# Patient Record
Sex: Female | Born: 1990 | Hispanic: Yes | Marital: Married | Smoking: Never smoker
Health system: Southern US, Community
[De-identification: ages and names within clinical notes are randomized; demographics above are authoritative.]

## PROBLEM LIST (undated history)

## (undated) DIAGNOSIS — F329 Major depressive disorder, single episode, unspecified: Secondary | ICD-10-CM

## (undated) DIAGNOSIS — F32A Depression, unspecified: Secondary | ICD-10-CM

## (undated) DIAGNOSIS — O0930 Supervision of pregnancy with insufficient antenatal care, unspecified trimester: Secondary | ICD-10-CM

## (undated) DIAGNOSIS — L509 Urticaria, unspecified: Secondary | ICD-10-CM

## (undated) HISTORY — DX: Urticaria, unspecified: L50.9

## (undated) HISTORY — DX: Supervision of pregnancy with insufficient antenatal care, unspecified trimester: O09.30

## (undated) HISTORY — DX: Major depressive disorder, single episode, unspecified: F32.9

## (undated) HISTORY — DX: Depression, unspecified: F32.A

---

## 2012-05-25 ENCOUNTER — Encounter: Payer: Self-pay | Admitting: Physician Assistant

## 2013-08-02 ENCOUNTER — Other Ambulatory Visit (HOSPITAL_COMMUNITY): Payer: Self-pay | Admitting: Physician Assistant

## 2013-08-02 DIAGNOSIS — Z3689 Encounter for other specified antenatal screening: Secondary | ICD-10-CM

## 2013-08-02 LAB — OB RESULTS CONSOLE ABO/RH: RH Type: POSITIVE

## 2013-08-02 LAB — OB RESULTS CONSOLE GC/CHLAMYDIA
Chlamydia: NEGATIVE
Gonorrhea: NEGATIVE

## 2013-08-02 LAB — OB RESULTS CONSOLE RUBELLA ANTIBODY, IGM: Rubella: IMMUNE

## 2013-08-02 LAB — OB RESULTS CONSOLE RPR: RPR: NONREACTIVE

## 2013-08-02 LAB — OB RESULTS CONSOLE HIV ANTIBODY (ROUTINE TESTING): HIV: NONREACTIVE

## 2013-08-02 LAB — OB RESULTS CONSOLE HEPATITIS B SURFACE ANTIGEN: Hepatitis B Surface Ag: NEGATIVE

## 2013-08-02 LAB — OB RESULTS CONSOLE ANTIBODY SCREEN: Antibody Screen: NEGATIVE

## 2013-08-05 ENCOUNTER — Encounter (HOSPITAL_COMMUNITY): Payer: Self-pay

## 2013-08-06 ENCOUNTER — Encounter (HOSPITAL_COMMUNITY): Payer: Self-pay

## 2013-08-06 ENCOUNTER — Ambulatory Visit (HOSPITAL_COMMUNITY)
Admission: RE | Admit: 2013-08-06 | Discharge: 2013-08-06 | Disposition: A | Discharge status: Home / Self Care | Payer: Medicaid Other | Source: Ambulatory Visit | Attending: Physician Assistant | Admitting: Physician Assistant

## 2013-08-06 NOTE — Progress Notes (Signed)
Genetic Counseling  High-Risk Gestation Note  Appointment Date:  08/06/2013 Referred By: Kathlen Mody,* Date of Birth:  07/24/1991    Pregnancy History: G2P1001 Estimated Date of Delivery: 11/21/13 Estimated Gestational Age: [redacted]w[redacted]d Attending: Particia Nearing, MD   I met with 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 Rebecca Nash, Rebecca Nash, provided interpretation for today's visit.   We began by reviewing the family history in detail. The patient reported that the father of the pregnancy was born missing his right external ear structure. She reported that he has some hearing loss on the right. To her knowledge, he has had no treatment for this, and he "has never seen a doctor." He is currently 21 years old and otherwise healthy. An underlying cause is not known for the congenital external ear malformation. The patient reported no known exposures for his mother during the pregnancy with him. He reportedly was born late, at approximately 10 months gestation and was described to be small. No additional relatives were reported with external ear malformations, including the couple's 35 year-old daughter.   Incomplete development and growth of the pinna can lead to a small, deformed, or absent (anotia) pinna. Microtia and anotia occur in approximately 1-3 per 10,000 births. The majority of cases are sporadic. However, microtia can be seen due to an underlying genetic syndrome or environmental factor. Underlying genetic syndromes that can have microtia as a feature include Goldenhar syndrome, branchiootorenal syndrome, and mandibulofacial dysostosis. Teratogenic exposures in pregnancy that have been associated with an increased risk for microtia include maternal diabetes, alcohol, isotretinoin, thalidomide, and mycophenolate. We reviewed that external ear malformations have an  increased chance of having associated malformations, such as cardiac defects, renal anomalies or facial clefts. The father of the pregnancy reportedly does not have additional health concerns. However, he has reportedly not been evaluated for additional malformations, such as renal anomalies. Isolated external ear abnormalities, particularly when unilateral, carry a low recurrence risk. However, rare autosomal dominant and autosomal recessive familial cases have been described. Thus, we discussed that recurrence risk could be up to 50%, in the case of autosomal dominant inheritance, but would more likely be low for the current pregnancy. A medical genetics evaluation would also be available to the father of the pregnancy, if desired, to assess for possible underlying causes for the external ear malformation in order to better assess recurrence risk for relatives. Given that an underlying genetic cause is not known for the patient's external ear malformation, prenatal genetic screening or testing would not be available in the current pregnancy. We discussed that targeted ultrasound would be available in later gestation and may be able to view external ear structures. However, [name] understands that ultrasound cannot diagnose or rule out all birth defects prenatally.   Additionally, the father of the pregnancy reportedly has a maternal great aunt with cleft lip and a female maternal first cousin once removed (the great-aunt's son) also with cleft lip. These relatives are reportedly otherwise healthy. An underlying cause is not known for their cleft lip. We discussed that cleft lip +/- cleft palate can be syndromic or isolated.  If these relatives have a syndromic form of clefting, the chance of having an affected child depends on the inheritance pattern of that condition.  If the relatives have an isolated form of clefting, we discussed the probable multifactorial inheritance and explained that  genetic testing for isolated cleft  lip +/- cleft palate is not currently available.  Based on the family history, this couple's chance to have a baby with an isolated cleft lip +/- cleft palate is expected to be the same as the general population risk in the case of multifactorial inheritance. We reviewed that targeted ultrasound is available to assess for orofacial clefting. However, ultrasound cannot diagnose or rule out all birth defects or genetic conditions prenatally.   The father of the pregnancy also reportedly has a female maternal first cousin once removed (his maternal uncle's grandson) with hearing loss. This relative is 21 years old and reportedly has hearing aids. He reportedly does not have external ear malformations. An underlying cause is not known for his hearing loss. Hearing loss can have many causes including genetic factors, environmental factors or a combination of both.  Sometimes hearing loss can occur as one feature of an underlying genetic condition or may be caused by a single nonworking gene. Additional information regarding a cause for their hearing loss is needed in order to most accurately assess the chance for relatives. However, given that this is a fifth degree relative to the pregnancy, recurrence risk would likely be low. Additional information may alter recurrence risk assessment.   [name] reported that her sister had a son with Moebius syndrome. He died at age 21 years old. He was reportedly born paralyzed. The patient reported that her sister's other son is healthy. She reported no additional relatives with health concerns in her family. Moebius syndrome is a congenital, nonprogressive facial weakness with limited abduction of one or both eyes. The facial nerve and abducens nerve are described to be most frequently involved. Additional features can include hearing loss and other cranial nerve dysfunction. Orofacial dysmorphism, limb malformations, motor  disability, musculoskeletal features, and neurodevelopmental delays have also been described.  We discussed that the majority of cases of Moebius syndrome occur sporadically. However, rare familial occurrence has been reported, which is consistent with autosomal dominant inheritance. We discussed that in autosomal dominant inheritance, each offspring of an individual with a particular condition has a 1 in 2 (50%) chance to also inherit the condition. Given the reported family history and given that the majority of case of Moebius syndrome are sporadic, recurrence risk for the current pregnancy would likely be low. The family histories were otherwise found to be noncontributory for birth defects, mental retardation, and known genetic conditions. Without further information regarding the provided family history, an accurate genetic risk cannot be calculated. Further genetic counseling is warranted if more information is obtained.  [name] previously had ultrasound performed in [place] on 07/14/13, prior to moving to [place]. Ultrasound report from that date indicates that visualized fetal anatomy appeared normal. Additionally, the ultrasound report from that date indicates that the patient had an elevated inhibin. However, we do not have records of this result to confirm this report and do not have documentation of the specific level of inhibin. Elevated maternal serum inhibin has been associated with an increased risk for growth restriction or poor pregnancy outcome later in pregnancy; therefore, a follow up ultrasound is typically offered for fetal growth in the third trimester. [name] is scheduled for follow-up ultrasound at [facility] on 08/25/13.   [name] denied exposure to environmental toxins or chemical agents. She denied the use of alcohol, tobacco or street drugs. She denied significant viral illnesses during the course of her  pregnancy. Her medical and surgical histories were noncontributory.  I counseled [name] regarding the above risks and available options.  The approximate face-to-face time with the genetic counselor was 45 minutes.  Quinn Plowman, MS Certified Genetic Counselor 08/06/2013

## 2013-08-10 ENCOUNTER — Encounter (HOSPITAL_COMMUNITY): Payer: Self-pay

## 2013-08-10 DIAGNOSIS — Z8489 Family history of other specified conditions: Secondary | ICD-10-CM | POA: Insufficient documentation

## 2013-08-10 DIAGNOSIS — Z8279 Family history of other congenital malformations, deformations and chromosomal abnormalities: Secondary | ICD-10-CM | POA: Insufficient documentation

## 2013-08-25 ENCOUNTER — Ambulatory Visit (HOSPITAL_COMMUNITY)
Admission: RE | Admit: 2013-08-25 | Discharge: 2013-08-25 | Disposition: A | Discharge status: Home / Self Care | Payer: Medicaid Other | Source: Ambulatory Visit | Attending: Physician Assistant | Admitting: Physician Assistant

## 2013-08-25 ENCOUNTER — Ambulatory Visit (HOSPITAL_COMMUNITY): Admission: RE | Admit: 2013-08-25 | Payer: Medicaid Other | Source: Ambulatory Visit

## 2013-08-25 DIAGNOSIS — Z3689 Encounter for other specified antenatal screening: Secondary | ICD-10-CM | POA: Insufficient documentation

## 2013-08-25 DIAGNOSIS — O0930 Supervision of pregnancy with insufficient antenatal care, unspecified trimester: Secondary | ICD-10-CM | POA: Insufficient documentation

## 2013-08-25 DIAGNOSIS — O352XX0 Maternal care for (suspected) hereditary disease in fetus, not applicable or unspecified: Secondary | ICD-10-CM | POA: Insufficient documentation

## 2013-10-28 LAB — OB RESULTS CONSOLE GBS: GBS: NEGATIVE

## 2013-11-11 NOTE — L&D Delivery Note (Signed)
Delivery Note At 4:05 AM a viable female was delivered via Vaginal, Spontaneous Delivery (Presentation: ;Occiput Anterior).  APGAR: 8, 9; weight: pending.   Placenta status: spontaneous, intact.  Cord: 3 vessels with the following complications: None.  Cord pH: not sent  Anesthesia: Epidural  Episiotomy: None Lacerations: None Suture Repair: N/A Est. Blood Loss (mL): 150  Mom to postpartum.  Baby to Couplet care / Skin to Skin.  Selena LesserBraimah, Tina 11/27/2013, 5:18 AM  I have seen and examined this patient and I agree with the above. Cam HaiSHAW, KIMBERLY 7:58 AM 11/27/2013

## 2013-11-22 ENCOUNTER — Other Ambulatory Visit (HOSPITAL_COMMUNITY): Payer: Self-pay | Admitting: Nurse Practitioner

## 2013-11-22 DIAGNOSIS — O480 Post-term pregnancy: Secondary | ICD-10-CM

## 2013-11-24 ENCOUNTER — Encounter (HOSPITAL_COMMUNITY): Payer: Self-pay | Admitting: *Deleted

## 2013-11-24 ENCOUNTER — Telehealth (HOSPITAL_COMMUNITY): Payer: Self-pay | Admitting: *Deleted

## 2013-11-24 NOTE — Telephone Encounter (Signed)
Preadmission screen [phone] interpreter number

## 2013-11-26 ENCOUNTER — Inpatient Hospital Stay (HOSPITAL_COMMUNITY): Payer: Medicaid Other | Admitting: Anesthesiology

## 2013-11-26 ENCOUNTER — Ambulatory Visit (HOSPITAL_COMMUNITY)
Admission: RE | Admit: 2013-11-26 | Discharge: 2013-11-26 | Disposition: A | Discharge status: Home / Self Care | Payer: Medicaid Other | Source: Ambulatory Visit | Attending: Nurse Practitioner | Admitting: Nurse Practitioner

## 2013-11-26 ENCOUNTER — Encounter (HOSPITAL_COMMUNITY): Payer: Medicaid Other | Admitting: Anesthesiology

## 2013-11-26 ENCOUNTER — Inpatient Hospital Stay (HOSPITAL_COMMUNITY)
Admission: AD | Admit: 2013-11-26 | Discharge: 2013-11-28 | DRG: 775 | Disposition: A | Discharge status: Home / Self Care | Payer: Medicaid Other | Source: Ambulatory Visit | Attending: Obstetrics & Gynecology | Admitting: Obstetrics & Gynecology

## 2013-11-26 ENCOUNTER — Encounter (HOSPITAL_COMMUNITY): Payer: Self-pay | Admitting: *Deleted

## 2013-11-26 DIAGNOSIS — O480 Post-term pregnancy: Secondary | ICD-10-CM

## 2013-11-26 DIAGNOSIS — Z8279 Family history of other congenital malformations, deformations and chromosomal abnormalities: Secondary | ICD-10-CM

## 2013-11-26 DIAGNOSIS — IMO0001 Reserved for inherently not codable concepts without codable children: Secondary | ICD-10-CM

## 2013-11-26 DIAGNOSIS — Z8489 Family history of other specified conditions: Secondary | ICD-10-CM

## 2013-11-26 LAB — CBC
HCT: 41.7 % (ref 36.0–46.0)
Hemoglobin: 14.5 g/dL (ref 12.0–15.0)
MCH: 29.7 pg (ref 26.0–34.0)
MCHC: 34.8 g/dL (ref 30.0–36.0)
MCV: 85.3 fL (ref 78.0–100.0)
Platelets: 136 10*3/uL — ABNORMAL LOW (ref 150–400)
RBC: 4.89 MIL/uL (ref 3.87–5.11)
RDW: 13.5 % (ref 11.5–15.5)
WBC: 13.3 10*3/uL — ABNORMAL HIGH (ref 4.0–10.5)

## 2013-11-26 MED ORDER — EPHEDRINE 5 MG/ML INJ: 10.0000 mg | INTRAVENOUS | Status: DC | PRN

## 2013-11-26 MED ORDER — IBUPROFEN 600 MG PO TABS
600.0000 mg | ORAL_TABLET | Freq: Four times a day (QID) | ORAL | Status: DC | PRN
Start: 2013-11-26 — End: 2013-11-27
  Administered 2013-11-27: 600 mg via ORAL
  Filled 2013-11-26: qty 1

## 2013-11-26 MED ORDER — CITRIC ACID-SODIUM CITRATE 334-500 MG/5ML PO SOLN
30.0000 mL | ORAL | Status: DC | PRN
Start: 2013-11-26 — End: 2013-11-27

## 2013-11-26 MED ORDER — FENTANYL 2.5 MCG/ML BUPIVACAINE 1/10 % EPIDURAL INFUSION (WH - ANES)
14.0000 mL/h | INTRAMUSCULAR | Status: DC | PRN
  Administered 2013-11-26: 14 mL/h via EPIDURAL
  Filled 2013-11-26: qty 125

## 2013-11-26 MED ORDER — PHENYLEPHRINE 40 MCG/ML (10ML) SYRINGE FOR IV PUSH (FOR BLOOD PRESSURE SUPPORT): 80.0000 ug | PREFILLED_SYRINGE | INTRAVENOUS | Status: DC | PRN

## 2013-11-26 MED ORDER — PHENYLEPHRINE 40 MCG/ML (10ML) SYRINGE FOR IV PUSH (FOR BLOOD PRESSURE SUPPORT)
80.0000 ug | PREFILLED_SYRINGE | INTRAVENOUS | Status: DC | PRN
  Filled 2013-11-26: qty 10

## 2013-11-26 MED ORDER — OXYCODONE-ACETAMINOPHEN 5-325 MG PO TABS
1.0000 | ORAL_TABLET | ORAL | Status: DC | PRN
  Administered 2013-11-27: 2 via ORAL
  Filled 2013-11-26: qty 2

## 2013-11-26 MED ORDER — ONDANSETRON HCL 4 MG/2ML IJ SOLN
4.0000 mg | Freq: Four times a day (QID) | INTRAMUSCULAR | Status: DC | PRN
  Administered 2013-11-27: 4 mg via INTRAVENOUS
  Filled 2013-11-26: qty 2

## 2013-11-26 MED ORDER — LACTATED RINGERS IV SOLN
INTRAVENOUS | Status: DC
  Administered 2013-11-26: via INTRAVENOUS

## 2013-11-26 MED ORDER — OXYTOCIN BOLUS FROM INFUSION
500.0000 mL | INTRAVENOUS | Status: DC
  Administered 2013-11-27: 500 mL via INTRAVENOUS

## 2013-11-26 MED ORDER — LACTATED RINGERS IV SOLN
500.0000 mL | Freq: Once | INTRAVENOUS | Status: AC
  Administered 2013-11-26: 500 mL via INTRAVENOUS

## 2013-11-26 MED ORDER — LIDOCAINE HCL (PF) 1 % IJ SOLN
INTRAMUSCULAR | Status: DC | PRN
  Administered 2013-11-26 (×2): 5 mL

## 2013-11-26 MED ORDER — EPHEDRINE 5 MG/ML INJ
10.0000 mg | INTRAVENOUS | Status: DC | PRN
  Filled 2013-11-26: qty 4

## 2013-11-26 MED ORDER — DIPHENHYDRAMINE HCL 50 MG/ML IJ SOLN: 12.5000 mg | INTRAMUSCULAR | Status: DC | PRN

## 2013-11-26 MED ORDER — OXYTOCIN 40 UNITS IN LACTATED RINGERS INFUSION - SIMPLE MED
62.5000 mL/h | INTRAVENOUS | Status: DC
  Administered 2013-11-27: 62.5 mL/h via INTRAVENOUS
  Filled 2013-11-26: qty 1000

## 2013-11-26 MED ORDER — LIDOCAINE HCL (PF) 1 % IJ SOLN
30.0000 mL | INTRAMUSCULAR | Status: DC | PRN
  Filled 2013-11-26: qty 30

## 2013-11-26 MED ORDER — ACETAMINOPHEN 325 MG PO TABS: 650.0000 mg | ORAL_TABLET | ORAL | Status: DC | PRN

## 2013-11-26 MED ORDER — LACTATED RINGERS IV SOLN
500.0000 mL | INTRAVENOUS | Status: DC | PRN
  Administered 2013-11-26: 500 mL via INTRAVENOUS

## 2013-11-26 NOTE — Anesthesia Procedure Notes (Signed)
Epidural Patient location during procedure: OB Start time: 11/26/2013 11:29 PM  Staffing Anesthesiologist: Brayton CavesJACKSON, Mikal Wisman Performed by: anesthesiologist   Preanesthetic Checklist Completed: patient identified, site marked, surgical consent, pre-op evaluation, timeout performed, IV checked, risks and benefits discussed and monitors and equipment checked  Epidural Patient position: sitting Prep: site prepped and draped and DuraPrep Patient monitoring: continuous pulse ox and blood pressure Approach: midline Injection technique: LOR air  Needle:  Needle type: Tuohy  Needle gauge: 17 G Needle length: 9 cm and 9 Needle insertion depth: 5 cm cm Catheter type: closed end flexible Catheter size: 19 Gauge Catheter at skin depth: 10 cm Test dose: negative  Assessment Events: blood not aspirated, injection not painful, no injection resistance, negative IV test and no paresthesia  Additional Notes Patient identified.  Risk benefits discussed including failed block, incomplete pain control, headache, nerve damage, paralysis, blood pressure changes, nausea, vomiting, reactions to medication both toxic or allergic, and postpartum back pain.  Patient expressed understanding and wished to proceed.  All questions were answered.  Sterile technique used throughout procedure and epidural site dressed with sterile barrier dressing. No paresthesia or other complications noted.The patient did not experience any signs of intravascular injection such as tinnitus or metallic taste in mouth nor signs of intrathecal spread such as rapid motor block. Please see nursing notes for vital signs.

## 2013-11-26 NOTE — H&P (Signed)
  History     CSN: [id number]  Arrival date and time: 11/26/13 1906   First Provider Initiated Contact with Patient 11/26/13 2136      Chief Complaint  Patient presents with  . Contractions   HPI This is a 23 y.o. female at [redacted]w[redacted]d who presents with c/o contractions. Also has some blood tinged mucous.   RN Note: I'm having contractions from 5-7mins apart. Was 2cm last ck at health dept. Ctx since 1630. Some mucousy bloody dc      OB History   Grav Para Term Preterm Abortions TAB SAB Ect Mult Living   2 1 1 0 0 0 0 0 0 1      Past Medical History  Diagnosis Date  . Depression     was on meds prior to moving here  . Late prenatal care     History reviewed. No pertinent past surgical history.  Family History  Problem Relation Age of Onset  . Other Other     Moebius syndrome  . Hypertension Mother   . Anemia Daughter     blood transfusion at 10mos  . Vision loss Daughter     eye surgery    History  Substance Use Topics  . Smoking status: Never Smoker   . Smokeless tobacco: Never Used  . Alcohol Use: Yes     Comment: early in pregnancy    Allergies: No Known Allergies  No prescriptions prior to admission    Review of Systems  Constitutional: Negative for fever, chills and malaise/fatigue.  Gastrointestinal: Positive for abdominal pain. Negative for nausea and vomiting.  Genitourinary: Negative for dysuria.  Neurological: Negative for dizziness and headaches.   Physical Exam   Blood pressure 115/71, pulse 80, temperature 98.8 F (37.1 C), resp. rate 20, height 4' 11.5" (1.511 m), weight 70.398 kg (155 lb 3.2 oz), last menstrual period 02/14/2013.  Physical Exam  Constitutional: She is oriented to person, place, and time. She appears well-developed and well-nourished. No distress.  Cardiovascular: Normal rate.   Respiratory: Effort normal.  GI: Soft. There is no tenderness. There is no rebound and no guarding.  Genitourinary: Vagina normal and uterus  normal.  Dilation: 3.5 Effacement (%): 70 Cervical Position: Middle Station: -2 Presentation: Vertex Exam by:: L. Munford NR   Musculoskeletal: Normal range of motion.  Neurological: She is alert and oriented to person, place, and time.  Skin: Skin is warm and dry.  Psychiatric: She has a normal mood and affect.   FHR reactive UCs every 3 - 4 minutes  MAU Course  Procedures  MDM Will have RN recheck patient one hour after first check  >>  Dilation: 4 Effacement (%): 70 Cervical Position: Middle Station: -2 Presentation: Vertex Exam by:: L. Munford RN   Assessment and Plan  A:  SIUP at [redacted]w[redacted]d       Labor Eval  >> Active Labor  P:  Admit to Birthing Suites      Routine orders      Desires epidural      GBS NEG  Jillene Wehrenberg 11/26/2013, 9:47 PM  

## 2013-11-26 NOTE — Anesthesia Preprocedure Evaluation (Signed)

## 2013-11-26 NOTE — MAU Provider Note (Signed)
  History     CSN: [id number]  Arrival date and time: 11/26/13 1906   First Provider Initiated Contact with Patient 11/26/13 2136      Chief Complaint  Patient presents with  . Contractions   HPI This is a 23 y.o. female at 707w5d who presents with c/o contractions. Also has some blood tinged mucous.   RN Note: I'm having contractions from 5-727mins apart. Was 2cm last ck at health dept. Ctx since 1630. Some mucousy bloody dc      OB History   Grav Para Term Preterm Abortions TAB SAB Ect Mult Living   2 1 1  0 0 0 0 0 0 1      Past Medical History  Diagnosis Date  . Depression     was on meds prior to moving here  . Late prenatal care     History reviewed. No pertinent past surgical history.  Family History  Problem Relation Age of Onset  . Other Other     Moebius syndrome  . Hypertension Mother   . Anemia Daughter     blood transfusion at 10mos  . Vision loss Daughter     eye surgery    History  Substance Use Topics  . Smoking status: Never Smoker   . Smokeless tobacco: Never Used  . Alcohol Use: Yes     Comment: early in pregnancy    Allergies: No Known Allergies  No prescriptions prior to admission    Review of Systems  Constitutional: Negative for fever, chills and malaise/fatigue.  Gastrointestinal: Positive for abdominal pain. Negative for nausea and vomiting.  Genitourinary: Negative for dysuria.  Neurological: Negative for dizziness and headaches.   Physical Exam   Blood pressure 115/71, pulse 80, temperature 98.8 F (37.1 C), resp. rate 20, height 4' 11.5" (1.511 m), weight 70.398 kg (155 lb 3.2 oz), last menstrual period 02/14/2013.  Physical Exam  Constitutional: She is oriented to person, place, and time. She appears well-developed and well-nourished. No distress.  Cardiovascular: Normal rate.   Respiratory: Effort normal.  GI: Soft. There is no tenderness. There is no rebound and no guarding.  Genitourinary: Vagina normal and uterus  normal.  Dilation: 3.5 Effacement (%): 70 Cervical Position: Middle Station: -2 Presentation: Vertex Exam by:: L. Munford NR   Musculoskeletal: Normal range of motion.  Neurological: She is alert and oriented to person, place, and time.  Skin: Skin is warm and dry.  Psychiatric: She has a normal mood and affect.   FHR reactive UCs every 3 - 4 minutes  MAU Course  Procedures  MDM Will have RN recheck patient one hour after first check  >>  Dilation: 4 Effacement (%): 70 Cervical Position: Middle Station: -2 Presentation: Vertex Exam by:: L. Munford RN   Assessment and Plan  A:  SIUP at 757w5d       Labor Eval  >> Active Labor  [facility]      Routine orders      Desires epidural      GBS NEG  [facility]ILLIAMS,MARIE 11/26/2013, 9:47 PM

## 2013-11-26 NOTE — MAU Note (Signed)
I'm having contractions from 5-27mins apart. Was 2cm last ck at health dept. Ctx since 1630. Some mucousy bloody dc

## 2013-11-27 ENCOUNTER — Encounter (HOSPITAL_COMMUNITY): Payer: Self-pay | Admitting: *Deleted

## 2013-11-27 LAB — RPR: RPR Ser Ql: NONREACTIVE

## 2013-11-27 LAB — TYPE AND SCREEN
ABO/RH(D): A POS
Antibody Screen: NEGATIVE

## 2013-11-27 LAB — ABO/RH: ABO/RH(D): A POS

## 2013-11-27 MED ORDER — DIBUCAINE 1 % RE OINT
1.0000 "application " | TOPICAL_OINTMENT | RECTAL | Status: DC | PRN
  Filled 2013-11-27: qty 28

## 2013-11-27 MED ORDER — ONDANSETRON HCL 4 MG PO TABS: 4.0000 mg | ORAL_TABLET | ORAL | Status: DC | PRN

## 2013-11-27 MED ORDER — SIMETHICONE 80 MG PO CHEW: 80.0000 mg | CHEWABLE_TABLET | ORAL | Status: DC | PRN

## 2013-11-27 MED ORDER — ZOLPIDEM TARTRATE 5 MG PO TABS: 5.0000 mg | ORAL_TABLET | Freq: Every evening | ORAL | Status: DC | PRN

## 2013-11-27 MED ORDER — OXYCODONE-ACETAMINOPHEN 5-325 MG PO TABS
1.0000 | ORAL_TABLET | ORAL | Status: DC | PRN
  Administered 2013-11-27: 2 via ORAL
  Administered 2013-11-27: 1 via ORAL
  Administered 2013-11-28 (×2): 2 via ORAL
  Filled 2013-11-27: qty 1
  Filled 2013-11-27 (×2): qty 2
  Filled 2013-11-27: qty 1
  Filled 2013-11-27: qty 2

## 2013-11-27 MED ORDER — IBUPROFEN 600 MG PO TABS
600.0000 mg | ORAL_TABLET | Freq: Four times a day (QID) | ORAL | Status: DC
  Administered 2013-11-27 – 2013-11-28 (×5): 600 mg via ORAL
  Filled 2013-11-27 (×5): qty 1

## 2013-11-27 MED ORDER — LANOLIN HYDROUS EX OINT: TOPICAL_OINTMENT | CUTANEOUS | Status: DC | PRN

## 2013-11-27 MED ORDER — TETANUS-DIPHTH-ACELL PERTUSSIS 5-2.5-18.5 LF-MCG/0.5 IM SUSP: 0.5000 mL | Freq: Once | INTRAMUSCULAR | Status: DC

## 2013-11-27 MED ORDER — SENNOSIDES-DOCUSATE SODIUM 8.6-50 MG PO TABS
2.0000 | ORAL_TABLET | ORAL | Status: DC
  Administered 2013-11-27: 2 via ORAL
  Filled 2013-11-27: qty 2

## 2013-11-27 MED ORDER — ONDANSETRON HCL 4 MG/2ML IJ SOLN: 4.0000 mg | INTRAMUSCULAR | Status: DC | PRN

## 2013-11-27 MED ORDER — BENZOCAINE-MENTHOL 20-0.5 % EX AERO
1.0000 | INHALATION_SPRAY | CUTANEOUS | Status: DC | PRN
Start: 2013-11-27 — End: 2013-11-28
  Filled 2013-11-27: qty 56

## 2013-11-27 MED ORDER — PRENATAL MULTIVITAMIN CH
1.0000 | ORAL_TABLET | Freq: Every day | ORAL | Status: DC
  Administered 2013-11-27 – 2013-11-28 (×2): 1 via ORAL
  Filled 2013-11-27 (×2): qty 1

## 2013-11-27 MED ORDER — DIPHENHYDRAMINE HCL 25 MG PO CAPS: 25.0000 mg | ORAL_CAPSULE | Freq: Four times a day (QID) | ORAL | Status: DC | PRN

## 2013-11-27 MED ORDER — WITCH HAZEL-GLYCERIN EX PADS
1.0000 | MEDICATED_PAD | CUTANEOUS | Status: DC | PRN
Start: 2013-11-27 — End: 2013-11-28

## 2013-11-27 NOTE — Lactation Note (Signed)
This note was copied from the chart of Rebecca Demitria Nash. Lactation Consultation Note  Patient Name: Rebecca Nash ZOXWR'UToday's Date: 11/27/2013 Reason for consult: Initial assessment;Other (Comment) (charting for exclusion) This is mom's second child. Mom has 794 yo and states she breastfed for 2 years.  However, she is giving some formula with bottle, stating that she has only a "little milk" so LC demonstrated small newborn stomach size during baby's first few days and discussed why exclusive breastfeeding is the ideal, based on LEAD guidelines.  FOB present and speaks some English but LC speaks Spanish and parents deny any questions after LC completes initial assessment and breastfeeding review.  LC encouraged STS and cue feedings.  LC provided [employer] Resource brochure in  Spanish, and reviewed WH services and list of community and web site resources. LC encouraged review of Baby and Me pp 13-16 for review of BF information in [place].   Maternal Data Formula Feeding for Exclusion: Yes Reason for exclusion: Mother's choice to formula and breast feed on admission Infant to breast within first hour of birth: Yes (initial LATCH score=7; nursed 15 minutes) Has patient been taught Hand Expression?: Yes (mom informs LC that she knows how to express colostrum and "sees drops:) Does the patient have breastfeeding experience prior to this delivery?: Yes  Feeding Feeding Type: Bottle Fed - Formula Length of feed: 40 min  LATCH Score/Interventions              Most recent LATCH score by RN assessment=8; baby has fed for 15-40 minutes 4 times since birth (now 113 hours old) but has also received 3 formula feedings of 10 ml's each        Lactation Tools Discussed/Used   STS, cue feedings LEAD cautions regarding formula supplement and small newborn stomach size and reasons for colostrum phase  Consult Status Consult Status: Follow-up Date: 11/28/13 Follow-up type:  In-patient    Warrick ParisianBryant, Eino Whitner [facility] 11/27/2013, 5:44 PM

## 2013-11-27 NOTE — Progress Notes (Signed)
Pt because nauseated, gaged a few times after completing peri care in bathroom.  Pt put back in bed and BP obtained. Zofran given, see MAR

## 2013-11-28 MED ORDER — IBUPROFEN 600 MG PO TABS: 600.0000 mg | ORAL_TABLET | Freq: Four times a day (QID) | ORAL | Status: DC | PRN

## 2013-11-28 MED ORDER — PRENATAL MULTIVITAMIN CH: 1.0000 | ORAL_TABLET | Freq: Every day | ORAL | Status: DC

## 2013-11-28 NOTE — Progress Notes (Signed)
Clinical Social Work Department PSYCHOSOCIAL ASSESSMENT - MATERNAL/CHILD 11/28/2013  Patient:  Rebecca Nash,Rebecca Nash  Account Number:  [id number]  Admit Date:  11/26/2013  Childs Name:   Rebecca Nash    Clinical Social Worker:  CUMI BEVEL, LCSW   Date/Time:  11/28/2013 09:00 AM  Date Referred:  11/27/2013   Referral source  Central Nursery     Referred reason  Depression/Anxiety   Other referral source:    I:  FAMILY / HOME ENVIRONMENT Child's legal guardian:  PARENT  Guardian - Name Guardian - Age Guardian - Address  Rebecca Nash,Angelize [address]   [place], [place] [address]  Nash, Rebecca  same as above   Other household support members/support persons Other support:    II  PSYCHOSOCIAL DATA Information Source:    Financial and Community Resources Employment:   Spouse is employed   Financial resources:  Medicaid If Medicaid - County:   Other  WIC  Food Stamps   School / Grade:   Maternity Care Coordinator / Child Services Coordination / Early Interventions:  Cultural issues impacting care:    III  STRENGTHS Strengths  Supportive family/friends  Home prepared for Child (including basic supplies)  Adequate Resources   Strength comment:    IV  RISK FACTORS AND CURRENT PROBLEMS Current Problem:     Risk Factor & Current Problem Patient Issue Family Issue Risk Factor / Current Problem Comment   Y N hx of depression    V  SOCIAL WORK ASSESSMENT Acknowledged order for Social Work consult to assess pt's history of depression.  Parents are married and have one other dependent age 4.  Met with both parents.  Spanish translator Debbie Salazar facilitated the interview. Parents were pleasant but guarded about mother's hx of depression.  She was unwilling to discuss precipitating factors that led to her depression.  Informed that she started seeing a therapist about 8 months ago while living in [place].  She denies any hx of taking medication to manage  her depression.    She denies any hx of psychiatric hospitalization.  Informed that she is now working with a local therapist.  She denies any current symptoms of depression or anxiety.    She denies any use of alcohol of illicit drug use during pregnancy.  Discussed signs/symptoms of PP depression with mother and spouse. They report adequate support at home.  No acute social concerns reported or noted at this time.  Parents informed of social work availability.      VI SOCIAL WORK PLAN Social Work Plan  No Further Intervention Required / No Barriers to Discharge   Type of pt/family education:   If child protective services report - county:   If child protective services report - date:   Information/referral to community resources comment:   Other social work plan:     

## 2013-11-28 NOTE — Discharge Instructions (Signed)
Cuidados en el postparto luego de un parto vaginal  (Postpartum Care After Vaginal Delivery) Despus del parto (perodo de postparto), la estada normal en el hospital es de 24-72 horas. Si hubo problemas con el trabajo de parto o el parto, o si tiene otros problemas mdicos, es posible que Patent attorney en el hospital por ms [place].  Mientras est en el hospital, recibir [place] e instrucciones sobre cmo cuidar de usted misma y de su beb recin nacido durante el postparto.  Mientras est en el hospital:   Asegrese de decirle a las enfermeras si siente dolor o Tree surgeon, as como donde Designer, television/film set y Architect.  Si usted tuvo una incisin cerca de la vagina (episiotoma) o si ha tenido Education officer, museum, las enfermeras le pondrn hielo sobre la episiotoma o Psychiatrist. Las bolsas de hielo pueden ayudar a Dietitian y la hinchazn.  Si est amamantando, puede sentir contracciones dolorosas en el tero durante algunas semanas. Esto es normal. Las contracciones ayudan a que el tero vuelva a su tamao normal.  Es normal tener algo de sangrado despus del Placedo.  Durante los primeros 1-3 das despus del parto, el flujo es de color rojo y la cantidad puede ser similar a un perodo.  Es frecuente que el flujo se inicie y se Production assistant, radio.  En los primeros [place], puede eliminar algunos cogulos pequeos. Informe a las enfermeras si elimina cogulos grandes o aumenta el flujo.  No  elimine los cogulos de sangre por el inodoro antes de que la [employer] Mining vea.  Durante los prximos 3 a [address] despus del parto, el flujo debe ser ms acuoso y rosado o Forensic psychologist.  Chancy Hurter a catorce [employer] del parto, el flujo debe ser una pequea cantidad de secrecin de color blanco amarillento.  La cantidad de flujo disminuir en las primeras semanas despus del parto. El flujo puede detenerse en 6-8 semanas. La mayora de las mujeres no tienen ms flujo a las 12 semanas  despus del [place].  Usted debe cambiar sus apsitos con frecuencia.  Lvese bien las manos con agua y jabn durante al menos 20 segundos despus de cambiar el apsito, usar el bao o antes de Nature conservation officer o Research scientist (life sciences) a su recin nacido.  Usted podr sentir como que tiene que vaciar la vejiga durante las primeras 6-8 horas despus del [place].  En caso de que sienta debilidad, mareo o [place], llame a la enfermera antes de levantarse de la cama por primera vez y antes de tomar una ducha por primera vez.  Dentro de los Coca[employer]a del parto, sus mamas pueden comenzar a estar sensibles y [place]. Esto se llama congestin. La sensibilidad en los senos por lo general desaparece dentro de las 48-72 horas despus de que ocurre la congestin. Tambin puede notar que la [place] se escapa de sus senos. Si no est amamantando no estimule sus pechos. La estimulacin de las mamas hace que sus senos produzcan ms [place].  Pasar tanto tiempo como le sea posible con el beb recin nacido es muy importante. Durante ese tiempo, usted y su beb deben sentirse cerca y conocerse uno al otro. Tener al beb en su habitacin (alojamiento conjunto) ayudar a fortalecer el vnculo con el beb recin nacido.Esto le dar tiempo para conocerlo y atenderlo de [place] cmoda.  Las hormonas se modifican despus del parto. A veces, los cambios hormonales pueden causar tristeza o ganas de llorar por un tiempo. Estos sentimientos  no deben durar ms de Hughes Supplyunos pocos das. Si duran ms que eso, debe hablar con su mdico.  Si lo desea, hable con su mdico acerca de los mtodos de planificacin familiar o mtodos anticonceptivos.  Hable con su mdico acerca de las vacunas. El mdico puede indicarle que se aplique las siguientes vacunas antes de salir del hospital:  [place] and PrincipeVacuna contra el ttanos, la difteria y la tos ferina (Tdap) o el ttanos y la difteria (Td). Es muy importante que usted y su familia (incluyendo a los abuelos) u otras  personas que cuidan al recin nacido estn al da con las vacunas Tdap o Td. Las vacunas Tdap o Td pueden ayudar a proteger al recin nacido de enfermedades.  Inmunizacin contra la rubola.  Inmunizacin contra la varicela.  Inmunizacin contra la gripe. Usted debe recibir esta vacunacin anual si no la ha recibido Academic librariandurante el embarazo. Document Released: 08/25/2007 Document Revised: 07/22/2012 [facility] Patient Information 2014 [place]ExitCare, MarylandLLC.  Lactancia materna (Breastfeeding) Decidir Museum/gallery exhibitions officeramamantar es una de las mejores elecciones que puede hacer por usted y su beb. El cambio hormonal durante el Psychiatristembarazo produce el desarrollo del tejido mamario y Lesothoaumenta la cantidad y el tamao de los conductos galactforos. Estas hormonas tambin permiten que las protenas, los azcares y las grasas de la sangre produzcan la WPS Resourcesleche materna en las glndulas productoras de Euniceleche. Las hormonas impiden que la leche materna sea liberada antes del nacimiento del beb, adems de impulsar el flujo de leche luego del nacimiento. Una vez que ha comenzado a Museum/gallery exhibitions officeramamantar, Conservation officer, naturepensar en el beb, as Immunologistcomo la succin o Theatre managerel llanto, pueden estimular la liberacin de Country Clubleche de las glndulas productoras de S[place] Springsleche.  LOS BENEFICIOS DE AMAMANTAR Para el beb  La primera leche (calostro) ayuda al mejor funcionamiento del sistema digestivo del beb.  La leche tiene anticuerpos que ayudan a Radio producerprevenir las infecciones en el beb.  El beb tiene una menor incidencia de asma, alergias y del sndrome de muerte sbita del lactante.  Los nutrientes en la Portlandleche materna son mejores para el beb que la Newingtonleche maternizada y estn preparados exclusivamente para cubrir las necesidades del beb.  La leche materna mejora el desarrollo cerebral del beb.  Es menos probable que el beb desarrolle otras enfermedades, como obesidad infantil, asma o diabetes mellitus de tipo 2. Para usted   La lactancia materna favorece el desarrollo de un vnculo muy especial  entre la madre y el beb.  Es conveniente. Siempre est disponible a la temperatura correcta y es Boydeconmica.  La lactancia materna ayuda a quemar caloras y a perder el peso ganado durante el Somersetembarazo.  Favorece la contraccin del tero al tamao que tena antes del embarazo de manera ms rpida y disminuye el sangrado (loquios) despus del parto.  La lactancia materna contribuye a reducir Nurse, adultel riesgo de desarrollar diabetes mellitus de tipo 2, osteoporosis o cncer de mama o de ovario en el futuro. SIGNOS DE QUE EL BEB EST HAMBRIENTO Primeros signos de [address]re  Aumenta su estado de Lesothoalerta o actividad.  Se estira.  Mueve la cabeza de un lado a otro.  Mueve la cabeza y abre la boca cuando se le toca la mejilla o la comisura de la boca (reflejo de bsqueda).  Aumenta las vocalizaciones, tales como sonidos de succin, se relame los labios, emite arrullos, suspiros, o chirridos.  Mueve la Jones Apparel Groupmano hacia la boca.  Se chupa con ganas los dedos o las manos. Signos tardos de Fisher Scientifichambre  Est agitado.  Ninfa LindenLlora de manera  intermitente. Signos de AES Corporation signos de hambre extrema requerirn que lo calme y lo consuele antes de que el beb pueda alimentarse adecuadamente. No espere a que se manifiesten los siguientes signos de hambre extrema para comenzar a Museum/gallery exhibitions officer:   Designer, jewellery.  Llanto intenso y fuerte.   Gritos. INFORMACIN BSICA SOBRE LA LACTANCIA MATERNA Iniciacin de la lactancia materna  Encuentre un lugar cmodo para sentarse o acostarse, con un buen respaldo para el cuello y la espalda.  Coloque una almohada o una manta enrollada debajo del beb para acomodarlo a la altura de la mama (si est sentada). Las almohadas para Museum/gallery exhibitions officer se han diseado especialmente a fin de servir de apoyo para los brazos y el beb [employer].  Asegrese de que el abdomen del beb est frente al suyo.  Masajee suavemente la mama. Con las yemas de los dedos, masajee la pared del pecho  hacia el pezn en un movimiento circular. Esto estimula el flujo de [place]. Es posible que Engineer, manufacturing systems este movimiento mientras amamanta si la leche fluye lentamente.  Sostenga la mama con el pulgar por arriba del pezn y los otros 4 dedos por debajo de la mama. Asegrese de que los dedos se encuentren lejos del pezn y de la boca del beb.  Empuje suavemente los labios del beb con el pezn o con el dedo.  Cuando la boca del beb se abra lo suficiente, acrquelo rpidamente a la mama e introduzca todo el pezn y la zona oscura que lo rodea (areola), tanto como sea posible, dentro de la boca del beb.  Debe haber ms areola visible por arriba del labio superior del beb que por debajo del labio inferior.  La lengua del beb debe estar entre la enca inferior y la [place].  Asegrese de que la boca del beb est en la posicin correcta alrededor del pezn (prendida). Los labios del beb deben crear un sello sobre la mama, doblndose hacia afuera (invertidos).  Es comn que el beb succione durante 2 a 3 minutos para que comience el flujo de [place]. Cmo debe prenderse Es muy importante que le ensee al beb cmo prenderse adecuadamente a la mama. Si el beb no se prende adecuadamente, puede causarle dolor en el pezn y reducir la produccin de [place], y hacer que el beb tenga un escaso aumento de [place]. Adems, si el beb no se prende adecuadamente al pezn, puede tragar aire durante la alimentacin. Esto puede causarle molestias al beb. Hacer eructar al beb al [place] de mama puede ayudarlo a liberar el aire. Sin embargo, ensearle al beb cmo prenderse a la mama adecuadamente es la mejor manera de evitar que se sienta molesto por tragar Oceanographer se alimenta. Signos de que el beb se ha prendido adecuadamente al pezn:   Payton Doughty o succiona de modo silencioso, sin causarle dolor.  Se escucha que traga cada 3 o 4 succiones.   Hay movimientos musculares por arriba y por  delante de sus odos al Printmaker. Signos de que el beb no se ha prendido Audiological scientist al pezn:   Hace ruidos de succin o de chasquido mientras se alimenta.  Dolor en el pezn. Si cree que el beb no se prendi correctamente, deslice el dedo en la comisura de la boca y Ameren Corporation las encas del beb para interrumpir la succin. Intente comenzar a amamantar nuevamente. Signos de Fish farm manager Signos del beb:   Disminucin gradual en el nmero de succiones o cese completo de la  succin.  Se duerme.  Relaja el cuerpo.  Retiene una pequea cantidad de Battlement Mesaleche en su boca.  Se desprende solo del pecho. Signos que presenta usted:  Las mamas han aumentado la firmeza, el peso y el tamao 1 a 3 horas despus de Museum/gallery exhibitions officeramamantar.  Estn ms blandas inmediatamente despus de amamantar.  Un aumento del volumen de Sunrayleche, y tambin el cambio de su consistencia y color se producen hacia el quinto da de Tour managerlactancia materna.  Los pezones no duelen, ni estn agrietados ni sangran. Signos de que su beb recibe la cantidad de leche suficiente  Moja al menos 3 paales en 24 horas. La orina debe ser clara y de color amarillo plido a los 5 [address]  [address] de [place]vida.  Defeca al menos 3 veces en 24 horas a los [address]  [address] de [address]. La materia fecal debe ser blanda y Onidaamarillenta.  Defeca al menos 3 veces en 24 horas a los [address] das de [address]. La materia fecal debe ser grumosa y Marseillesamarillenta.  No registra una prdida de peso mayor del 10% del peso al nacer durante los primeros 3 [address]  [address] de [place]vida.  Aumenta de peso un promedio de 4 a 7onzas (120 a 210ml) por semana despus de los 4 [address]  [address] de vida.  Aumenta de Lawrencepeso, Garwooddiariamente, de Mount Victorymanera consistente a Glass blower/designerpartir de los 5 [address]  [address] de vida, sin Passenger transport managerregistrar prdida de peso despus de las 2 semanas de vida. Despus de alimentarse, es posible que el beb regurgite una pequea cantidad. Esto es frecuente. FRECUENCIA Y DURACIN DE LA LACTANCIA MATERNA El amamantamiento frecuente la  ayudar a producir ms Azerbaijanleche y a Education officer, communityprevenir problemas de Engineer, miningdolor en los pezones e hinchazn en las [place] Dum[place]stonmamas. Alimente al beb cuando muestre signos de hambre o si siente la necesidad de reducir la congestin de las [place] Citymamas. Esto se denomina "lactancia a demanda". Evite el uso del chupete mientras trabaja para establecer la lactancia (las primeras 4 a 6 semanas despus del nacimiento del beb). Despus de este perodo, podr ofrecerle un chupete. Las investigaciones demostraron que el uso del chupete durante el primer ao de vida del beb disminuye el riesgo de desarrollar el sndrome de muerte sbita del lactante (SMSL). Permita que el nio se alimente en cada mama todo lo que desee. Contine amamantando al beb hasta que haya terminado de alimentarse. Cuando el beb se desprende o se queda dormido mientras se est alimentando de la primera mama, ofrzcale la segunda. Debido a que, con frecuencia, los recin Sunoconacidos permanecen somnolientos las primeras semanas de vida, es posible que deba despertar a su beb para alimentarlo. Los horarios de Acupuncturistlactancia varan de un beb a otro. Sin embargo, las siguientes reglas pueden servir como gua para ayudarle a Lawyergarantizar que el beb se alimenta adecuadamente:  Se puede amamantar a los recin nacidos (bebs de 4 semanas o menos de vida) cada 1 a 3 horas.  No deben transcurrir ms de 3 horas durante el da o 5 horas durante la noche sin que se amamante a los recin nacidos.  Debe amamantar al beb 8 veces como mnimo, en un perodo de 24 horas, hasta que comience a introducir slidos en su dieta, a los 6 meses de vida aproximadamente. EXTRACCIN DE Dean Foods CompanyLECHE MATERNA La extraccin y Contractorel almacenamiento de la leche materna le permiten asegurarse de que el beb se alimente exclusivamente de Naturitaleche materna, aun en momentos en los que no puede amamantar. Esto tiene especial importancia si debe regresar al Aleen Campitrabajo en el perodo en que an est amamantando  o si no puede estar presente en  los momentos en que el beb debe alimentarse. Su asesor en lactancia puede orientarla sobre cunto tiempo es seguro almacenar [place].  El sacaleche es un aparato que le permite extraer leche de la mama a un recipiente estril. Luego, la leche materna extrada puede almacenarse en un refrigerador o freezer. Algunos sacaleches son Birdie Riddle, Delaney Meigs otros son elctricos. Consulte a su asesor en lactancia qu tipo ser ms conveniente para usted. Los sacaleches se pueden comprar, sin embargo, algunos hospitales y grupos de apoyo a la lactancia materna alquilan Sports coach. Un asesor en lactancia puede ensearle cmo extraer W. [employer], en caso de que prefiera no usar un sacaleche.  CMO CUIDAR LAS MAMAS DURANTE LA LACTANCIA MATERNA Los pezones se secan, agrietan y duelen durante la Tour manager. Las siguientes recomendaciones pueden ayudarle a Pharmacologist las TEPPCO Partners y sanas:  Careers information officer usar jabn en los pezones.  Use un sostn de soporte. Aunque no son esenciales, las camisetas sin mangas o los sostenes especiales para Museum/gallery exhibitions officer estn diseados para acceder fcilmente a las mamas, para Museum/gallery exhibitions officer sin tener que quitarse todo el sostn o la camiseta. Evite usar sostenes con aro o sostenes muy ajustados.  Seque al aire sus pezones durante 3 a despus de amamantar al beb.  Utilice solo apsitos de Haematologist sostn para Environmental health practitioner las prdidas de [place]. La prdida de un poco de Public Service Enterprise Group tomas es normal.  Utilice lanolina sobre los pezones luego de Museum/gallery exhibitions officer. La lanolina ayuda a mantener la humedad normal de la piel. Si [place] lanolina pura, no tiene que lavarse los pezones antes de Corporate treasurer al beb. La lanolina pura no es txica para el beb. Adems, puede extraer [employer] Homes algunas gotas de [place] materna y Engineer, maintenance (IT) suavemente esa Winn[employer], para que la Wiley Ford se seque al aire. Durante las primeras semanas despus de dar a  luz, algunas mujeres pueden experimentar hinchazn en las mamas (congestin [place]). La congestin puede hacer que sienta las mamas pesadas, calientes y sensibles al tacto. El pico de la congestin ocurre dentro de los 3 a 5 das despus del Garrochales. Las siguientes recomendaciones pueden ayudarle a Paramedic la congestin:  Vace por completo las mamas al QUALCOMM o Environmental health practitioner. Puede aplicar calor hmedo en las mamas (en la ducha o con toallas hmedas para manos) antes de Museum/gallery exhibitions officer o extraer WPS Resources. Esto aumenta la circulacin y [place] a que la [place]. Si el beb no vaca por completo las [address] cuando lo [address], extraiga la [place] restante despus de que haya finalizado.  Use un sostn ajustado (para amamantar o comn) o camiseta sin mangas durante 1 o 2 das para indicar al cuerpo que disminuya ligeramente la produccin de [place].  Aplique compresas de hielo Yahoo! Inc, a menos que le resulte demasiado incmodo.  Asegrese de que el beb se encuentre en la posicin correcta mientras lo alimenta. Si la congestin persiste luego de 48 horas o despus de seguir estas recomendaciones, comunquese con su mdico o un [place] representative. RECOMENDACIONES GENERALES PARA EL CUIDADO DE LA SALUD DURANTE LA LACTANCIA MATERNA  Consuma alimentos saludables. Alterne comidas y colaciones, comiendo 3 de cada Agricultural engineer. Dado que lo que come Danaher Corporation, es posible que algunas comidas hagan que su beb se vuelva ms irritable de lo habitual. Evite comer este tipo de alimentos, si percibe que afectan de manera negativa al beb.  Lowe's Companies, jugos de  fruta y agua para satisfacer su sed (aproximadamente 10 vasos al da).  Descanse con frecuencia, reljese y tome sus vitaminas prenatales para evitar la fatiga, el estrs y la anemia.  Contine con los autocontroles de la mama.  Evite masticar y fumar tabaco.  Evite el consumo de alcohol y drogas. Algunos medicamentos, que pueden ser  perjudiciales para el beb, pueden pasar a travs de la Colgate Palmolive. Es importante que consulte a su mdico antes de Medical sales representative, incluidos todos los medicamentos recetados y de [place], as como los suplementos vitamnicos y herbales. Puede quedar embarazada durante la lactancia. Si desea controlar la natalidad, consulte a su mdico cules son las opciones ms seguras para el beb. SOLICITE ATENCIN MDICA SI:   Usted siente que quiere dejar de Museum/gallery exhibitions officer o se siente frustrada con la lactancia.  Siente dolor en las mamas o en los pezones.  Sus pezones estn agrietados o Water quality scientist.  Sus pechos estn irritados, sensibles o calientes.  Tiene un rea hinchada en cualquiera de las mamas.  Siente escalofros o fiebre.  Tiene nuseas o vmitos.  Presenta una secrecin de otro lquido distinto de la leche materna de los pezones.  Sus mamas no se llenan antes de Museum/gallery exhibitions officer al beb para el 5. da despus del parto.  Se siente triste y deprimida.  El beb est demasiado somnoliento como para comer bien.  El beb tiene problemas para dormir.  Moja menos de 3 paales en 24 horas.  Defeca menos de 3 veces en 24 horas.  La piel del beb o la parte blanca de sus ojos est amarilla.  El beb no ha aumentado de [place] a los [address] de [place]. SOLICITE ATENCIN MDICA DE INMEDIATO SI:   El beb est muy cansado (aletargado) y no se despierta para comer.  Le sube la fiebre sin causa. Document Released: 10/28/2005 Document Revised: 02/22/2013 [facility] Patient Information 2014 [place], [place].

## 2013-11-28 NOTE — Discharge Summary (Signed)
Obstetric Discharge Summary Reason for Admission: onset of labor Prenatal Procedures: ultrasound Intrapartum Procedures: spontaneous vaginal delivery Postpartum Procedures: none Complications-Operative and Postpartum: none Eating, drinking, voiding, ambulating well.  +flatus.  Lochia and pain wnl.  Denies dizziness, lightheadedness, or sob. No complaints. Desires d/c today.   Hemoglobin  Date Value Range Status  11/26/2013 14.5  12.0 - 15.0 g/dL Final     HCT  Date Value Range Status  11/26/2013 41.7  36.0 - 46.0 % Final    Physical Exam:  General: alert, cooperative and no distress Lochia: appropriate Uterine Fundus: firm Incision: n/a DVT Evaluation: No evidence of DVT seen on physical exam. Negative Homan's sign. No cords or calf tenderness. No significant calf/ankle edema.  Discharge Diagnoses: Term Pregnancy-delivered  Discharge Information: Date: 11/28/2013 Activity: pelvic rest Diet: routine Medications: PNV and Ibuprofen Condition: stable Instructions: refer to practice specific booklet Discharge to: home Follow-up Information   Schedule an appointment as soon as possible for a visit with [facility]. (4-6 weeks for your postpartum visit, and a visit to have your mirena iud put in as well)    Contact information:   [address] [place] [id number] [phone]      Newborn Data: Live born female  Birth Weight: 8 lb 11.7 oz (3960 g) APGAR: 8, 9  Home with mother. Breast and bottlefeeding, desires mirena for contraception, declines circumcision  Marge DuncansBooker, Kayln Garceau Randall 11/28/2013, 8:00 AM

## 2013-11-28 NOTE — Discharge Summary (Signed)
Attestation of Attending Supervision of Advanced Practitioner (PA/CNM/NP): Evaluation and management procedures were performed by the Advanced Practitioner under my supervision and collaboration.  I have reviewed the Advanced Practitioner's note and chart, and I agree with the management and plan.  Cassady Stanczak, MD, FACOG Attending Obstetrician & Gynecologist Faculty Practice, [facility]  

## 2013-11-28 NOTE — Anesthesia Postprocedure Evaluation (Signed)
  Anesthesia Post-op Note  Patient: Rebecca Nash  Procedure(s) Performed: * No procedures listed *  Patient Location: PACU and Mother/Baby  Anesthesia Type:Epidural  Level of Consciousness: awake, alert  and oriented  Airway and Oxygen Therapy: Patient Spontanous Breathing  Post-op Pain: none  Post-op Assessment: Post-op Vital signs reviewed, Patient's Cardiovascular Status Stable, No headache, No backache, No residual numbness and No residual motor weakness  Post-op Vital Signs: Reviewed and stable  Complications: No apparent anesthesia complications 

## 2013-11-28 NOTE — Anesthesia Postprocedure Evaluation (Deleted)
  Anesthesia Post-op Note  Patient: Rebecca Nash  Procedure(s) Performed: * No procedures listed *  Patient Location: PACU and Mother/Baby  Anesthesia Type:Epidural  Level of Consciousness: awake, alert  and oriented  Airway and Oxygen Therapy: Patient Spontanous Breathing  Post-op Pain: none  Post-op Assessment: Post-op Vital signs reviewed, Patient's Cardiovascular Status Stable, No headache, No backache, No residual numbness and No residual motor weakness  Post-op Vital Signs: Reviewed and stable  Complications: No apparent anesthesia complications

## 2013-11-29 NOTE — Progress Notes (Signed)
Post discharge chart review completed.  

## 2013-12-01 ENCOUNTER — Inpatient Hospital Stay (HOSPITAL_COMMUNITY): Admission: RE | Admit: 2013-12-01 | Payer: MEDICAID | Source: Ambulatory Visit

## 2014-03-02 IMAGING — US US OB COMP +14 WK
1 series · 12 of 28 positions shown · non-contrast
Comparison: none

[Series 1: us ob comp +14 wk · 12 of 84 slices shown]
[im 4/84]
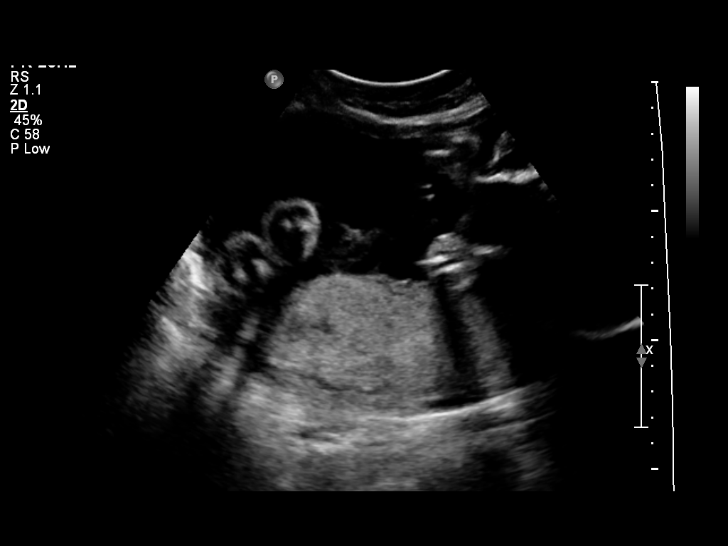
[im 10/84]
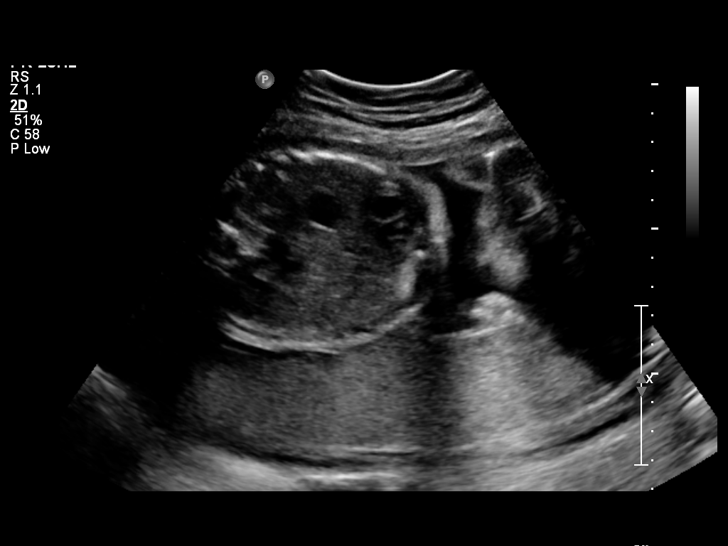
[im 16/84]
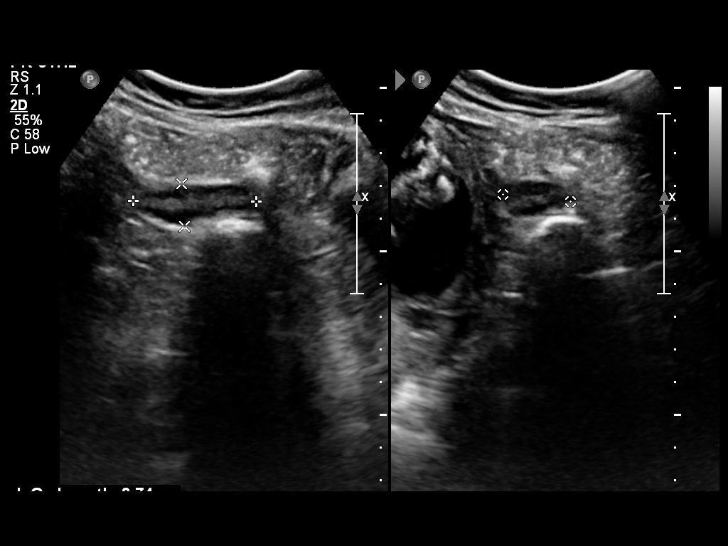
[im 25/84]
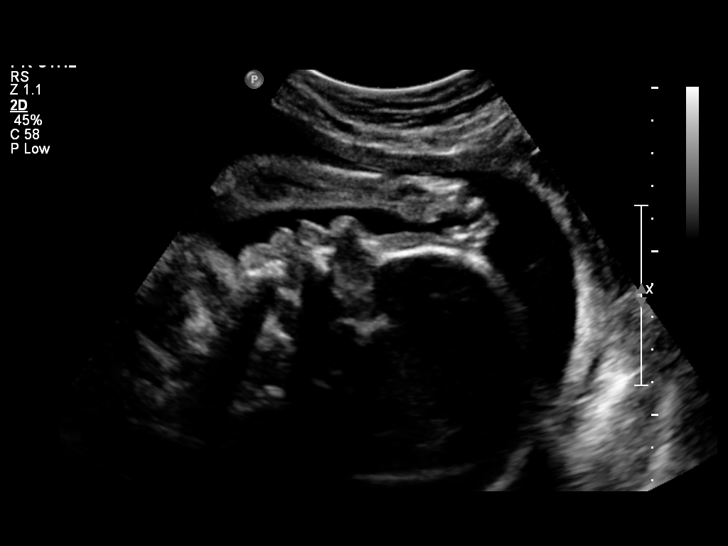
[im 31/84]
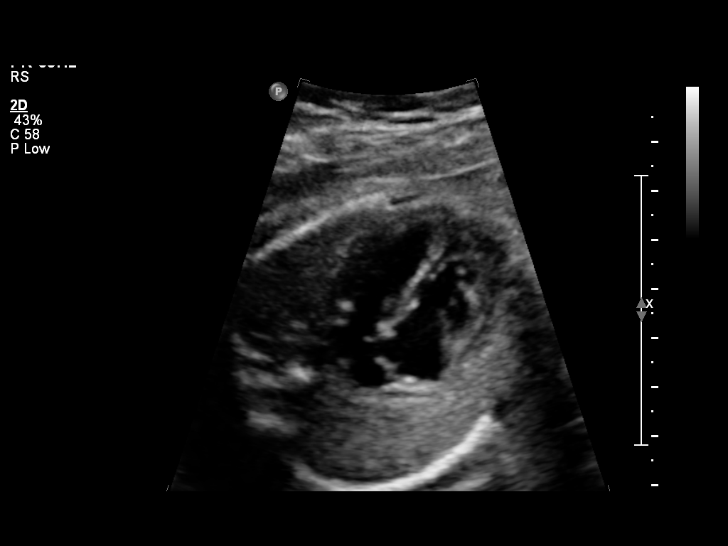
[im 37/84]
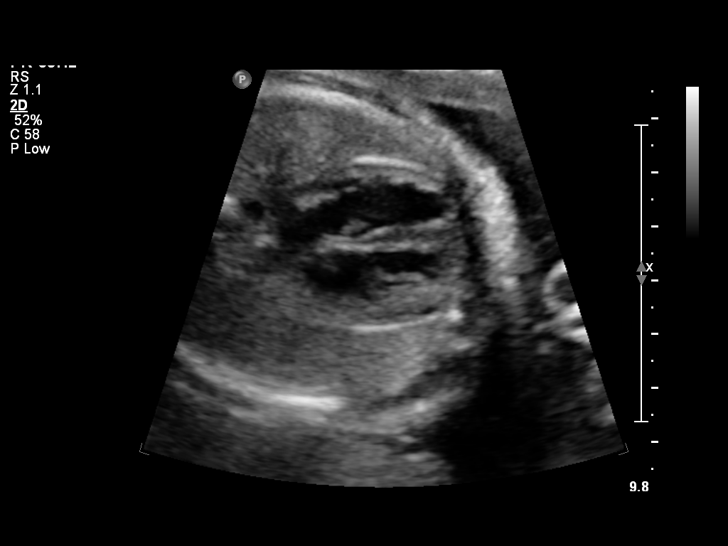
[im 47/84]
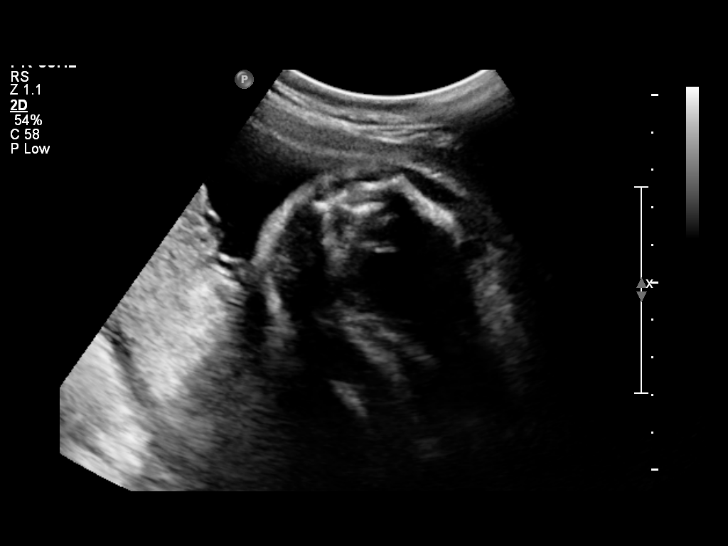
[im 53/84]
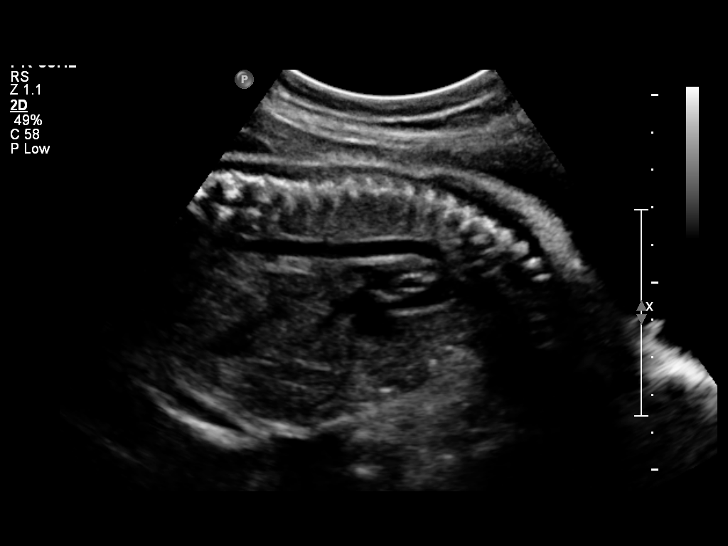
[im 59/84]
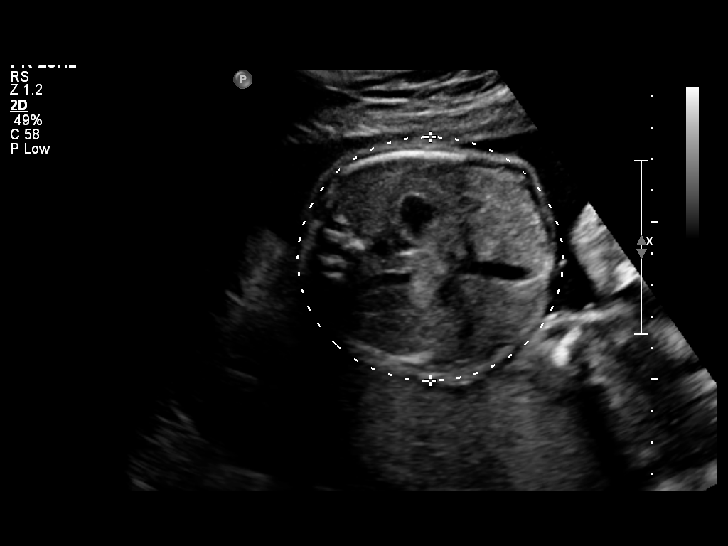
[im 68/84]
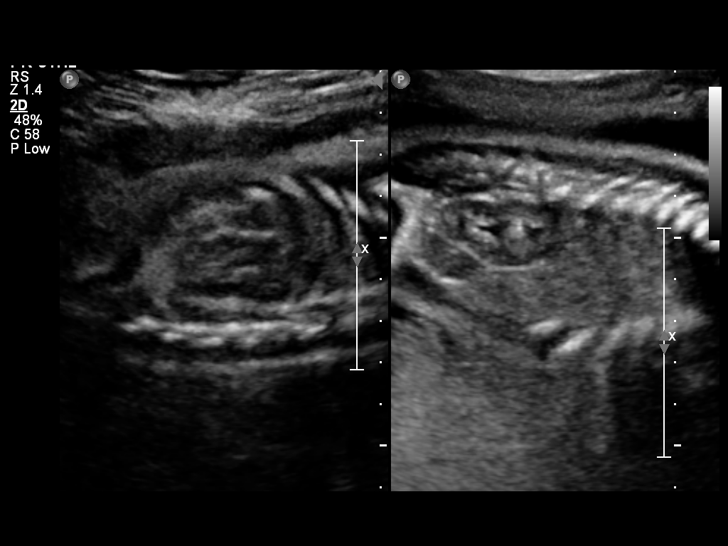
[im 74/84]
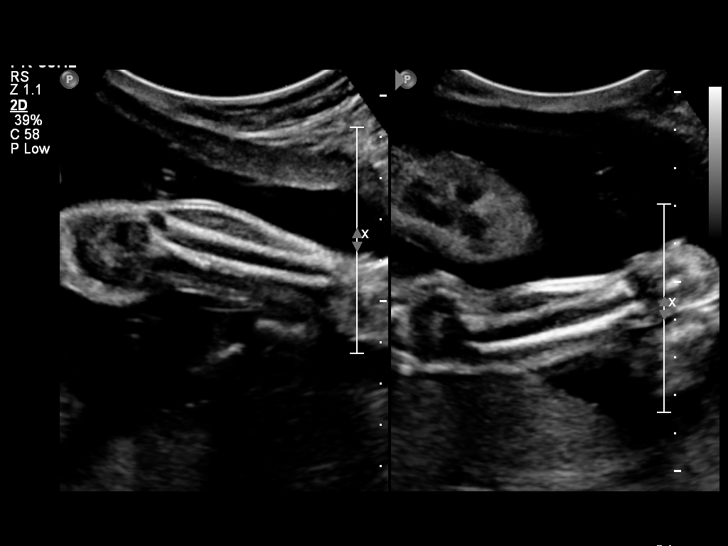
[im 80/84]
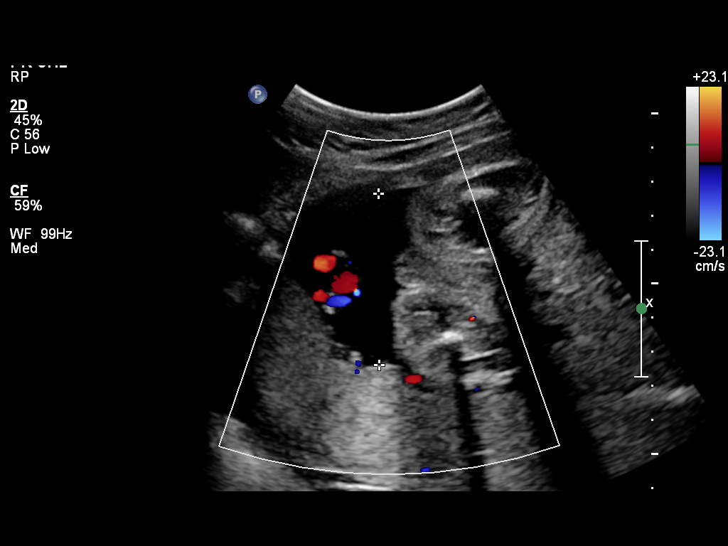

[12 of 28 positions shown; findings below may reference images not displayed]

OBSTETRICS REPORT
                      (Signed Final 08/26/2013 [DATE])

             JAYLON

Service(s) Provided

 US OB COMP + 14 WK                                    76805.1
Indications

 Basic anatomic survey
 Late Prenatal Care
 History of genetic / anatomic abnormality - (FOB
 congenital ear malformation (unilateral agenesis)
 History of genetic / anatomic abnormality - patient's
 nephew with Moebius syndrome
Fetal Evaluation

 Num Of Fetuses:    1
 Fetal Heart Rate:  152                          bpm
 Cardiac Activity:  Observed
 Presentation:      Cephalic
 Placenta:          Posterior, above cervical
                    os
 P. Cord            Visualized
 Insertion:

 Amniotic Fluid
 AFI FV:      Subjectively within normal limits
 AFI Sum:     16.81   cm       62  %Tile     Larg Pckt:    6.69  cm
 RUQ:   5.01    cm   RLQ:    5.11   cm    LUQ:   6.69    cm   LLQ:    0      cm
Biometry

 BPD:     73.4  mm     G. Age:  29w 3d                CI:        76.95   70 - 86
                                                      FL/HC:      18.5   18.6 -

 HC:       265  mm     G. Age:  28w 6d       67   %   HC/AC:      1.06   1.05 -

 AC:     250.9  mm     G. Age:  29w 2d       90   %   FL/BPD:     66.8   71 - 87
 FL:        49  mm     G. Age:  26w 4d       13   %   FL/AC:      19.5   20 - 24
 HUM:     46.9  mm     G. Age:  27w 4d       50   %

 Est. FW:    4545   gm   2 lb 11 oz      71  %
Gestational Age

 LMP:           27w 3d        Date:  02/14/13                 EDD:   11/21/13
 U/S Today:     28w 4d                                        EDD:   11/13/13
 Best:          27w 3d     Det. By:  LMP  (02/14/13)          EDD:   11/21/13
Anatomy

 Cranium:          Appears normal         Aortic Arch:      Appears normal
 Fetal Cavum:      Appears normal         Ductal Arch:      Appears normal
 Ventricles:       Appears normal         Diaphragm:        Appears normal
 Choroid Plexus:   Appears normal         Stomach:          Appears normal, left
                                                            sided
 Cerebellum:       Appears normal         Abdomen:          Appears normal
 Posterior Fossa:  Appears normal         Abdominal Wall:   Appears nml (cord
                                                            insert, abd wall)
 Nuchal Fold:      Not applicable (>20    Cord Vessels:     Appears normal (3
                   wks GA)                                  vessel cord)
 Face:             Appears normal         Kidneys:          Appear normal
                   (orbits and profile)
 Lips:             Appears normal         Bladder:          Appears normal
 Heart:            Appears normal         Spine:            Appears normal
                   (4CH, axis, and
                   situs)
 RVOT:             Appears normal         Lower             Visualized
                                          Extremities:
 LVOT:             Appears normal         Upper             Visualized
                                          Extremities:

 Other:  Fetus appears to be a male. Open hands visualized bilaterally.
Targeted Anatomy

 Fetal Central Nervous System
 Lat. Ventricles:
Cervix Uterus Adnexa

 Cervical Length:    4.2       cm

 Cervix:       Normal appearance by transabdominal scan.
 Left Ovary:    Size(cm) L: 3.74 x W: 2.07 x H: 1.3  Volume(cc):
 Right Ovary:   Size(cm) L: 3.43 x W: 2.3 x H: 2.48  Volume(cc):
 Adnexa:     No abnormality visualized.
Comments

 The patient's fetal anatomic survey is now complete.  No fetal
 anomalies or soft markers of aneuploidy were seen.  No
 further ultrasounds are required unless additional problems
 arise.
Impression

 Single living intrauterine pregnancy at 27 weeks 3 days.
 Appropriate fetal growth (71%).
 Normal amniotic fluid volume.
 Normal fetal anatomy.
 No fetal anomalies or soft markers of aneuploidy seen.
Recommendations

 Follow-up ultrasounds as clinically indicated.
                Fabrice, Yovilen

## 2014-09-12 ENCOUNTER — Encounter (HOSPITAL_COMMUNITY): Payer: Self-pay | Admitting: *Deleted

## 2016-05-12 ENCOUNTER — Encounter (HOSPITAL_COMMUNITY): Payer: Self-pay | Admitting: Emergency Medicine

## 2016-05-12 ENCOUNTER — Emergency Department (HOSPITAL_COMMUNITY)
Admission: EM | Admit: 2016-05-12 | Discharge: 2016-05-13 | Disposition: A | Discharge status: Home / Self Care | Payer: Medicaid Other | Attending: Emergency Medicine | Admitting: Emergency Medicine

## 2016-05-12 DIAGNOSIS — N390 Urinary tract infection, site not specified: Secondary | ICD-10-CM | POA: Insufficient documentation

## 2016-05-12 LAB — I-STAT CHEM 8, ED
BUN: 13 mg/dL (ref 6–20)
Calcium, Ion: 1.29 mmol/L (ref 1.13–1.30)
Chloride: 102 mmol/L (ref 101–111)
Creatinine, Ser: 0.6 mg/dL (ref 0.44–1.00)
Glucose, Bld: 83 mg/dL (ref 65–99)
HCT: 32 % — ABNORMAL LOW (ref 36.0–46.0)
Hemoglobin: 10.9 g/dL — ABNORMAL LOW (ref 12.0–15.0)
Potassium: 3.6 mmol/L (ref 3.5–5.1)
Sodium: 141 mmol/L (ref 135–145)
TCO2: 26 mmol/L (ref 0–100)

## 2016-05-12 LAB — POC URINE PREG, ED: Preg Test, Ur: NEGATIVE

## 2016-05-12 NOTE — ED Provider Notes (Signed)
CSN: [id number]     Arrival date & time 05/12/16  2258 History  By signing my name below, I, Bridgette HabermannMaria Tan, attest that this documentation has been prepared under the direction and in the presence of Nour Scalise, PA-C. Electronically Signed: Bridgette HabermannMaria Tan, ED Scribe. 05/12/2016. 11:35 PM.   Chief Complaint  Patient presents with  . Dysuria  . Vaginal Discharge    The history is provided by the patient and a relative. A language interpreter was used (Family member).    HPI Comments: Rebecca Nash is a 25 y.o. female with PMHx of depression who presents to the Emergency Department complaining of moderate dysuria onset 5 days ago. She reports burning sensation when she urinates. Denies vaginal pain at any other time. She endorses associated lower abdominal pain and right-sided back pain. The abdominal pain is exacerbated when she urinates. She describes it as an aching sensation. She denies exacerbating factors of the back pain. The back pain does not radiate into the lower abdomen. She has not tried any over-the-counter medications. Patient denies vaginal discharge. Patient had told triage that she was having vaginal discharge. Asked multiple times and patient states this was a misunderstanding and she is not having any discharge. Denies concern for STD exposure. Denies fevers, chills, nausea, vomiting, dizziness, syncope, URI symptoms, chest pain, shortness of breath, hematuria, dyspareunia or rashes of the labia.  Past Medical History  Diagnosis Date  . Depression     was on meds prior to moving here  . Late prenatal care    History reviewed. No pertinent past surgical history. Family History  Problem Relation Age of Onset  . Other Other     Moebius syndrome  . Hypertension Mother   . Anemia Daughter     blood transfusion at 10mos  . Vision loss Daughter     eye surgery   Social History  Substance Use Topics  . Smoking status: Never Smoker   . Smokeless tobacco: Never Used  .  Alcohol Use: Yes   OB History    Gravida Para Term Preterm AB TAB SAB Ectopic Multiple Living   2 2 2  0 0 0 0 0 0 2     Review of Systems  Gastrointestinal: Positive for abdominal pain. Negative for nausea and vomiting.  Genitourinary: Positive for dysuria and flank pain. Negative for hematuria and vaginal discharge.  All other systems reviewed and are negative.   Allergies  Review of patient's allergies indicates no known allergies.  Home Medications   Prior to Admission medications   Medication Sig Start Date End Date Taking? Authorizing Provider  ibuprofen (ADVIL,MOTRIN) 600 MG tablet Take 1 tablet (600 mg total) by mouth every 6 (six) hours as needed for mild pain, moderate pain or cramping. Patient not taking: Reported on 05/12/2016 11/28/13   Cheral MarkerKimberly R Booker, CNM  Prenatal Vit-Fe Fumarate-FA (PRENATAL MULTIVITAMIN) TABS tablet Take 1 tablet by mouth daily at 12 noon. Patient not taking: Reported on 05/12/2016 11/28/13   Cheral MarkerKimberly R Booker, CNM  sulfamethoxazole-trimethoprim (BACTRIM DS,SEPTRA DS) 800-160 MG tablet Take 1 tablet by mouth 2 (two) times daily. 05/13/16 05/20/16  Shaquera Ansley, PA-C   BP 111/64 mmHg  Pulse 74  Temp(Src) 99.1 F (37.3 C) (Oral)  Resp 16  Ht 5\' 1"  (1.549 m)  Wt 58.968 kg  BMI 24.58 kg/m2  SpO2 100%  LMP 04/19/2016 (Approximate) Physical Exam  Constitutional: She is oriented to person, place, and time. She appears well-developed and well-nourished. No distress.  Nontoxic-appearing  HENT:  Head: Normocephalic and atraumatic.  Eyes: Conjunctivae are normal. Right eye exhibits no discharge. Left eye exhibits no discharge. No scleral icterus.  Neck: Normal range of motion.  Cardiovascular: Normal rate, regular rhythm and normal heart sounds.   Pulmonary/Chest: Effort normal and breath sounds normal. No respiratory distress.  Abdominal: Soft. Normal appearance and bowel sounds are normal. There is tenderness in the suprapubic area. There is CVA  tenderness (right). There is no rigidity, no rebound and no guarding.  Abdomen is soft with mild tenderness in the suprapubic region. No peritoneal signs. Right-sided CVA tenderness.  Musculoskeletal: Normal range of motion.  Neurological: She is alert and oriented to person, place, and time. Coordination normal.  Skin: Skin is warm and dry.  No rashes or skin changes noted to the right flank  Psychiatric: She has a normal mood and affect. Her behavior is normal.  Nursing note and vitals reviewed.   ED Course  Procedures  DIAGNOSTIC STUDIES: Oxygen Saturation is 100% on RA, normal by my interpretation.    COORDINATION OF CARE: 11:31 PM Discussed treatment plan with pt at bedside which includes urinalysis and pt agreed to plan.  Labs Review Labs Reviewed  URINALYSIS, ROUTINE W REFLEX MICROSCOPIC (NOT [facility]) - Abnormal; Notable for the following:    APPearance CLOUDY (*)    Leukocytes, UA LARGE (*)    All other components within normal limits  URINE MICROSCOPIC-ADD ON - Abnormal; Notable for the following:    Squamous Epithelial / LPF 0-5 (*)    Bacteria, UA FEW (*)    All other components within normal limits  I-STAT CHEM 8, ED - Abnormal; Notable for the following:    Hemoglobin 10.9 (*)    HCT 32.0 (*)    All other components within normal limits  POC URINE PREG, ED    MDM   Final diagnoses:  UTI (urinary tract infection), uncomplicated   25 year old female presenting with dysuria, suprapubic tenderness and right-sided flank pain 5 days. No other associated symptoms. Mildly febrile at 99.1. Hemodynamically stable. Nontoxic-appearing. Suprapubic tenderness to palpation without peritoneal signs. Right-sided CVA tenderness present. Kidney function normal on chem 8. UA with large leukocytes and a few bacteria. We'll send urine for culture. Presentation consistent with urinary tract infection and likely uncomplicated pyelonephritis. Discussed multiple times with patient her  initial complaint of vaginal discharge which she states was a misunderstanding and she is not having any discharge, pelvic exam is not indicated at this time. Patient is appropriate for outpatient treatment with Bactrim. Patient does not have PCP. Given referral information for local primary care offices and encouraged patient to follow-up. Return precautions given in discharge paperwork and discussed with pt at bedside. Pt stable for discharge  I personally performed the services described in this documentation, which was scribed in my presence. The recorded information has been reviewed and is accurate.    Alveta HeimlichStevi Peighton Mehra, PA-C 05/13/16 0103

## 2016-05-12 NOTE — ED Notes (Signed)
Pt. reports dysuria with vaginal discharge onset 5 days ago , denies hematuria / no fever .

## 2016-05-13 LAB — URINE MICROSCOPIC-ADD ON: RBC / HPF: NONE SEEN RBC/hpf (ref 0–5)

## 2016-05-13 LAB — POC URINE PREG, ED: Preg Test, Ur: NEGATIVE

## 2016-05-13 LAB — URINALYSIS, ROUTINE W REFLEX MICROSCOPIC
Bilirubin Urine: NEGATIVE
Glucose, UA: NEGATIVE mg/dL
Hgb urine dipstick: NEGATIVE
Ketones, ur: NEGATIVE mg/dL
Nitrite: NEGATIVE
Protein, ur: NEGATIVE mg/dL
Specific Gravity, Urine: 1.011 (ref 1.005–1.030)
pH: 7 (ref 5.0–8.0)

## 2016-05-13 MED ORDER — SULFAMETHOXAZOLE-TRIMETHOPRIM 800-160 MG PO TABS: 1.0000 | ORAL_TABLET | Freq: Two times a day (BID) | ORAL | Status: AC

## 2016-05-13 NOTE — Discharge Instructions (Signed)
Pielonefritis en los adultos °(Pyelonephritis, Adult) °La pielonefritis es una infección del riñón. Los riñones son los órganos que filtran la sangre y eliminan los residuos del torrente sanguíneo a través de la orina. La orina pasa desde los riñones, a través de los uréteres, hacia la vejiga. Hay dos tipos principales de pielonefritis: °· Infecciones que se inician rápidamente sin síntomas previos (pielonefritis aguda). °· Infecciones que persisten durante un período prolongado (pielonefritis crónica). °En la mayoría de los casos, la infección desaparece con el tratamiento y no causa otros problemas. Las infecciones más graves o crónicas a veces pueden propagarse al torrente sanguíneo u ocasionar otros problemas en los riñones. °CAUSAS °Por lo general, entre las causas de esta afección, se incluyen las siguientes: °· Bacterias que pasan desde la vejiga al riñón a través de la orina infectada. La orina de la vejiga puede infectarse por bacterias relacionadas con estas causas: °¨ Infección en la vejiga (cistitis). °¨ Inflamación de la próstata (prostatitis). °¨ Relaciones sexuales en las mujeres. °· Bacterias que pasan del torrente sanguíneo al riñón. °FACTORES DE RIESGO °Es más probable que esta afección se manifieste en: °· Las embarazadas. °· Las personas de edad avanzada. °· Los diabéticos. °· Las personas que tienen cálculos en los riñones o la vejiga. °· Las personas que tienen otras anomalías en el riñón o los uréteres. °· Las personas que tienen una sonda vesical. °· Las personas con cáncer. °· Las personas que son sexualmente activas. °· Las mujeres que usan espermicidas. °· Las personas que han tenido una infección previa en las vías urinarias. °SÍNTOMAS °Los síntomas de esta afección incluyen lo siguiente: °· Ganas frecuentes de orinar. °· Necesidad intensa o persistente de orinar. °· Sensación de ardor o escozor al orinar. °· Dolor abdominal. °· Dolor de espalda. °· Dolor al costado del cuerpo o en la  fosa lumbar. °· Fiebre. °· Escalofríos. °· Sangre en la orina u orina oscura. °· Náuseas. °· Vómitos. °DIAGNÓSTICO °Esta afección se puede diagnosticar en función de lo siguiente: °· Examen físico e historia clínica. °· Análisis de orina. °· Análisis de sangre. °También pueden hacerle estudios de diagnóstico por imágenes de los riñones, por ejemplo, una ecografía o una tomografía computarizada. °TRATAMIENTO °El tratamiento de esta afección puede depender de la gravedad de la infección. °· Si la infección es leve y se detecta rápidamente, pueden administrarle antibióticos por vía oral. Deberá tomar líquido para permanecer hidratado. °· Si la infección es más grave, es posible que deban hospitalizarlo para administrarle antibióticos directamente en una vena a través de una vía intravenosa (IV). Quizás también deban administrarle líquidos a través de una vía intravenosa si no se encuentra bien hidratado. Después de la hospitalización, es posible que deba tomar antibióticos durante un tiempo. °Podrán prescribirle otros tratamientos según la causa de la infección. °INSTRUCCIONES PARA EL CUIDADO EN EL HOGAR °Medicamentos °· Tome los medicamentos de venta libre y los recetados solamente como se lo haya indicado el médico. °· Si le recetaron un antibiótico, tómelo como se lo haya indicado el médico. No deje de tomar los antibióticos aunque comience a sentirse mejor. °Instrucciones generales °· Beba suficiente líquido para mantener la orina clara o de color amarillo pálido. °· Evite la cafeína, el té y las bebidas gaseosas. Estas sustancias irritan la vejiga. °· Orine con frecuencia. Evite retener la orina durante largos períodos. °· Orine antes y después de las relaciones sexuales. °· Después de defecar, las mujeres deben higienizarse la región perineal desde adelante hacia atrás. Use cada trozo de   papel higiénico solo una vez. °· Concurra a todas las visitas de control como se lo haya indicado el médico. Esto es  importante. °SOLICITE ATENCIÓN MÉDICA SI: °· Los síntomas no mejoran después de 2 días de tratamiento. °· Los síntomas empeoran. °· Tiene fiebre. °SOLICITE ATENCIÓN MÉDICA DE INMEDIATO SI: °· No puede tomar los antibióticos ni ingerir líquidos. °· Comienza a sentir escalofríos. °· Vomita. °· Siente un dolor intenso en la espalda o en la fosa lumbar. °· Se desmaya o siente una debilidad extrema. °  °Esta información no tiene como fin reemplazar el consejo del médico. Asegúrese de hacerle al médico cualquier pregunta que tenga. °  °Document Released: 08/07/2005 Document Revised: 07/19/2015 °Elsevier Interactive Patient Education ©2016 [employer] ° °

## 2017-07-03 ENCOUNTER — Other Ambulatory Visit (HOSPITAL_COMMUNITY): Payer: Self-pay | Admitting: Nurse Practitioner

## 2017-07-03 DIAGNOSIS — Z3682 Encounter for antenatal screening for nuchal translucency: Secondary | ICD-10-CM

## 2017-07-03 LAB — OB RESULTS CONSOLE RPR: RPR: NONREACTIVE

## 2017-07-03 LAB — OB RESULTS CONSOLE ABO/RH: RH Type: POSITIVE

## 2017-07-03 LAB — OB RESULTS CONSOLE HEPATITIS B SURFACE ANTIGEN: Hepatitis B Surface Ag: NEGATIVE

## 2017-07-03 LAB — OB RESULTS CONSOLE ANTIBODY SCREEN: Antibody Screen: NEGATIVE

## 2017-07-03 LAB — OB RESULTS CONSOLE GC/CHLAMYDIA
Chlamydia: NEGATIVE
Gonorrhea: NEGATIVE

## 2017-07-03 LAB — OB RESULTS CONSOLE HIV ANTIBODY (ROUTINE TESTING): HIV: NONREACTIVE

## 2017-07-03 LAB — OB RESULTS CONSOLE RUBELLA ANTIBODY, IGM: Rubella: IMMUNE

## 2017-07-16 ENCOUNTER — Encounter (HOSPITAL_COMMUNITY): Payer: Self-pay | Admitting: *Deleted

## 2017-07-17 ENCOUNTER — Ambulatory Visit (HOSPITAL_COMMUNITY)
Admission: RE | Admit: 2017-07-17 | Discharge: 2017-07-17 | Disposition: A | Discharge status: Home / Self Care | Payer: Medicaid Other | Source: Ambulatory Visit | Attending: Nurse Practitioner | Admitting: Nurse Practitioner

## 2017-07-17 ENCOUNTER — Other Ambulatory Visit (HOSPITAL_COMMUNITY): Payer: Self-pay | Admitting: Nurse Practitioner

## 2017-07-17 ENCOUNTER — Encounter (HOSPITAL_COMMUNITY): Payer: Self-pay

## 2017-07-17 DIAGNOSIS — Z3682 Encounter for antenatal screening for nuchal translucency: Secondary | ICD-10-CM

## 2017-07-17 DIAGNOSIS — Z8489 Family history of other specified conditions: Secondary | ICD-10-CM | POA: Diagnosis not present

## 2017-07-17 DIAGNOSIS — Z3491 Encounter for supervision of normal pregnancy, unspecified, first trimester: Secondary | ICD-10-CM

## 2017-07-17 DIAGNOSIS — Z3A13 13 weeks gestation of pregnancy: Secondary | ICD-10-CM | POA: Insufficient documentation

## 2017-07-18 ENCOUNTER — Encounter (HOSPITAL_COMMUNITY): Payer: Self-pay

## 2017-07-18 DIAGNOSIS — Z3A13 13 weeks gestation of pregnancy: Secondary | ICD-10-CM | POA: Insufficient documentation

## 2017-07-18 NOTE — Progress Notes (Signed)
Genetic Counseling  High-Risk Gestation Note  Appointment Date:  07/18/2017 Referred By: Rebecca Ammons, NP Date of Birth:  1991-02-21  Partner:  Rebecca Nash   Pregnancy History: [id number] Estimated Date of Delivery: 01/20/18 Estimated Gestational Age: 24w2dAttending: MRenella Cunas MD  I met with 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 Spanish/English interpreter present for today's visit.   In summary:  Couple's daughter recently diagnosed with suspected Sotos syndrome  [facility]  Clinical features suggestive of Sotos syndrome  Reviewed autosomal dominant inheritance  M14of cases (>95%) occur de novo  Recurrence risk for siblings when parents unaffected is estimated to be <1%  Discussed options of screening / testing  Daughter has identified varian of unknown significance in NSD1 gene  Reviewed limitations in offering prenatal testing for VUS  Patient stated she does not want prenatal screening or testing for Sotos syndrome  Patient declined screening and testing for fetal chromosome conditions  We began by updating the family history in detail. Ms. Rebecca Bambergerwas previously seen for genetic counseling in 2014 given separate family history concerns. The couple's daughter, ACaryl Nash [facility]os [facility] She reportedly has clinical features suggestive of Sotos syndrome, and she had molecular testing for NSD1, which identified Rebecca variant of unknown significance. While this VUS is not reported at this time to be pathogenic, it is also reported to not be benign. No additional relatives are reported to have features of Sotos syndrome. The couple's second child, Rebecca son, reportedly has typical development and has not been found to have features suggestive of Sotos syndrome.   Given that Ms. Rebecca Bambergerrecently met with  medical genetics and was counseled in detail regarding her daughter's suspected diagnosis, we briefly reviewed Sotos syndrome today. We discussed genes, chromosomes, and autosomal dominant inheritance. Sotos syndrome is described to have three cardinal features of distinctive facial features, learning disability, and overgrowth, occurring in >90% of affected individuals. Additional major clinical features are also reported. Expressivity is reported to be highly variable, even among relatives with the same pathogenic variant. Increased incidence of preeclampsia has been reported during pregnancy in cases of Rebecca fetus affected with Sotos syndrome. Prenatal ultrasound findings that may be present in fetuses with Sotos syndrome including macrocephaly or increased length can also be present in pregnancies for various other causes.   Sotos syndrome has been identified to be caused by Rebecca heterozygous change in the NSD1 gene and follows autosomal dominant inheritance. The majority of cases, >95%, occur de novo and are not inherited from parents. When an individual has Rebecca pathogenic NSD1 change, recurrence risk to offspring is 1 in 2 (50%). In the case of an apparently unaffected parents with one child with Sotos syndrome, recurrence risk for siblings to the affected individual is estimated to be less than 1%, given that gonadal mosaicism cannot be ruled out. When an identified causative gene change has been identified in the family, prenatal diagnosis via chorionic villus sampling or amniocentesis would be available for the familial pathogenic variant. We reviewed the risks, benefits, and limitations of amniocentesis including the associated 1 in [id number] for complications associated with amniocentesis. However, we discussed that given that the NSD1 change identified in the family is currently classified as Rebecca VUS, prenatal diagnosis for this variant may not provide clear information for the pregnancy. Additionally,  prenatal screening for NSD1 is available on cell  free DNA through noninvasive prenatal screening for single genes ([facility]). Benefits and limitations of this screening were reviewed including that the detection rate is not 100% and that typically only known pathogenic variants are reported, while detection of variant of unknown significance would not typically be reported on this testing methodology. Rebecca Nash stated that she is not concerned about the recurrence risk for Sotos syndrome in the current pregnancy, and she is not interested in prenatal screening or testing (including cell free DNA or amniocentesis) in the pregnancy.   The family histories were otherwise found to be noncontributory for updates regarding birth defects, intellectual disability, and known genetic conditions. See previous genetic counseling note from 2014 for previous detailed family history discussion. Without further information regarding the provided family history, an accurate genetic risk cannot be calculated. Further genetic counseling is warranted if more information is obtained.  We reviewed available screening and diagnostic options for fetal aneuploidy in pregnancy.  Regarding screening tests, we discussed the options of First screen, Quad screen and ultrasound.  She understands that screening tests are used to modify Rebecca patient's Rebecca priori risk for aneuploidy, typically based on age. This estimate provides Rebecca pregnancy specific risk assessment. Given the patient's age alone, the current pregnancy is considered low risk for fetal aneuploidy.   We also reviewed the availability of diagnostic options including CVS and amniocentesis.  We discussed the risks, limitations, and benefits of each. We discussed the possible results that the tests might provide including: positive, negative, unanticipated, and no result. Finally, they were counseled regarding the cost of each option and potential out of pocket expenses.  After  reviewing these options, Rebecca Nash elected to have ultrasound only, but declined first trimester screening. She declined prenatal screening and testing for fetal aneuploidy. She understands that ultrasound cannot rule out all birth defects or genetic syndromes. The patient was advised of this limitation and states she still does not want diagnostic testing at this time.   Rebecca Nash denied exposure to environmental toxins or chemical agents. She denied the use of alcohol, tobacco or street drugs. She denied significant viral illnesses during the course of her pregnancy.   I counseled Rebecca Nash regarding the above risks and available options.  The approximate face-to-face time with the genetic counselor was 40 minutes.  Chipper Oman, MS Certified Genetic Counselor 07/18/2017

## 2017-07-24 ENCOUNTER — Other Ambulatory Visit: Payer: Self-pay

## 2017-11-11 NOTE — L&D Delivery Note (Signed)
Rebecca Nash is a 27 y.o. female G3P2002 with IUP at 5752w1d admitted for postdates IOL.  She progressed with cytotec, foley balloon and AROM for augmentation to complete and pushed 5 minutes to deliver. Delivery complicated by 60 sec shoulder dystocia.    Delivery Note At 10:12 AM a viable female was delivered via Vaginal, Spontaneous (Presentation: LOA).  APGAR: 7, 9; weight 7 lb 14.3 oz (3580 g).    With delivery of the head, no movement of shoulders was noted with next push. Shoulder dystocia identified.  First maneuver: McRoberts Second maneuver: Suprapubic pressure Third maneuver: Gaskin Fourth Maneuver: delivery of posterior arm  Total time of dystocia: 60 seconds  After second maneuver, Dr. Ashok PallWouk called to bedside. With delivery of posterior arm, anterior shoulder delivered with ease and infant was delivered. Cord clamped and cut by CNM and handed to RN at warmer. Baby returned skin to skin with mom after 5 minute APGAR.  Placenta status: Spontaneous, intact. Cord: 3 vessels. Cord pH: 7.33  Anesthesia: IV fentanyl  Episiotomy: None Lacerations: 1st degree, hemodynamically stable Suture Repair: n/a Est. Blood Loss (mL): 200  Mom to postpartum.  Baby to Couplet care / Skin to Skin.  Rolm BookbinderCaroline M [name] CNM 01/28/2018, 10:37 AM

## 2017-12-11 ENCOUNTER — Encounter (HOSPITAL_COMMUNITY): Payer: Self-pay

## 2017-12-17 LAB — OB RESULTS CONSOLE GBS: GBS: NEGATIVE

## 2017-12-17 LAB — OB RESULTS CONSOLE GC/CHLAMYDIA
Chlamydia: NEGATIVE
Gonorrhea: NEGATIVE

## 2018-01-23 ENCOUNTER — Encounter (HOSPITAL_COMMUNITY): Payer: Self-pay | Admitting: *Deleted

## 2018-01-23 ENCOUNTER — Telehealth (HOSPITAL_COMMUNITY): Payer: Self-pay | Admitting: *Deleted

## 2018-01-23 NOTE — Telephone Encounter (Signed)
Preadmission screen  

## 2018-01-23 NOTE — Telephone Encounter (Signed)
Interpreter number [phone]

## 2018-01-28 ENCOUNTER — Inpatient Hospital Stay (HOSPITAL_COMMUNITY): Payer: Medicaid Other | Admitting: Certified Registered Nurse Anesthetist

## 2018-01-28 ENCOUNTER — Encounter (HOSPITAL_COMMUNITY)
Admission: RE | Disposition: A | Discharge status: Home / Self Care | Payer: Self-pay | Source: Ambulatory Visit | Attending: Obstetrics and Gynecology

## 2018-01-28 ENCOUNTER — Encounter (HOSPITAL_COMMUNITY): Payer: Self-pay

## 2018-01-28 ENCOUNTER — Inpatient Hospital Stay (HOSPITAL_COMMUNITY)
Admission: RE | Admit: 2018-01-28 | Discharge: 2018-01-30 | DRG: 797 | Disposition: A | Discharge status: Home / Self Care | Payer: Medicaid Other | Source: Ambulatory Visit | Attending: Obstetrics and Gynecology | Admitting: Obstetrics and Gynecology

## 2018-01-28 DIAGNOSIS — Z302 Encounter for sterilization: Secondary | ICD-10-CM | POA: Diagnosis not present

## 2018-01-28 DIAGNOSIS — D696 Thrombocytopenia, unspecified: Secondary | ICD-10-CM | POA: Diagnosis present

## 2018-01-28 DIAGNOSIS — O480 Post-term pregnancy: Secondary | ICD-10-CM | POA: Diagnosis present

## 2018-01-28 DIAGNOSIS — O9912 Other diseases of the blood and blood-forming organs and certain disorders involving the immune mechanism complicating childbirth: Secondary | ICD-10-CM | POA: Diagnosis present

## 2018-01-28 DIAGNOSIS — Z3A41 41 weeks gestation of pregnancy: Secondary | ICD-10-CM

## 2018-01-28 HISTORY — PX: TUBAL LIGATION: SHX77

## 2018-01-28 LAB — RPR: RPR Ser Ql: NONREACTIVE

## 2018-01-28 LAB — CBC
HCT: 38.7 % (ref 36.0–46.0)
Hemoglobin: 13.1 g/dL (ref 12.0–15.0)
MCH: 30.1 pg (ref 26.0–34.0)
MCHC: 33.9 g/dL (ref 30.0–36.0)
MCV: 89 fL (ref 78.0–100.0)
Platelets: 129 10*3/uL — ABNORMAL LOW (ref 150–400)
RBC: 4.35 MIL/uL (ref 3.87–5.11)
RDW: 13.6 % (ref 11.5–15.5)
WBC: 10.5 10*3/uL (ref 4.0–10.5)

## 2018-01-28 LAB — TYPE AND SCREEN
ABO/RH(D): A POS
Antibody Screen: NEGATIVE

## 2018-01-28 LAB — ABO/RH: ABO/RH(D): A POS

## 2018-01-28 SURGERY — LIGATION, FALLOPIAN TUBE, POSTPARTUM
Anesthesia: Spinal | Laterality: Bilateral

## 2018-01-28 MED ORDER — LACTATED RINGERS IV SOLN
INTRAVENOUS | Status: DC | PRN
  Administered 2018-01-28: 14:00:00 via INTRAVENOUS

## 2018-01-28 MED ORDER — OXYCODONE HCL 5 MG/5ML PO SOLN: 5.0000 mg | Freq: Once | ORAL | Status: DC | PRN

## 2018-01-28 MED ORDER — OXYCODONE-ACETAMINOPHEN 5-325 MG PO TABS
1.0000 | ORAL_TABLET | ORAL | Status: DC | PRN
  Administered 2018-01-28: 1 via ORAL
  Filled 2018-01-28: qty 1

## 2018-01-28 MED ORDER — MISOPROSTOL 50MCG HALF TABLET
50.0000 ug | ORAL_TABLET | ORAL | Status: DC | PRN
  Administered 2018-01-28 (×2): 50 ug via BUCCAL
  Filled 2018-01-28 (×3): qty 1

## 2018-01-28 MED ORDER — SIMETHICONE 80 MG PO CHEW: 80.0000 mg | CHEWABLE_TABLET | ORAL | Status: DC | PRN

## 2018-01-28 MED ORDER — ONDANSETRON HCL 4 MG PO TABS: 4.0000 mg | ORAL_TABLET | ORAL | Status: DC | PRN

## 2018-01-28 MED ORDER — BUPIVACAINE HCL (PF) 0.25 % IJ SOLN
INTRAMUSCULAR | Status: AC
  Filled 2018-01-28: qty 30

## 2018-01-28 MED ORDER — OXYCODONE HCL 5 MG PO TABS
5.0000 mg | ORAL_TABLET | ORAL | Status: DC | PRN
  Administered 2018-01-29 – 2018-01-30 (×3): 5 mg via ORAL
  Filled 2018-01-28 (×2): qty 1

## 2018-01-28 MED ORDER — PRENATAL MULTIVITAMIN CH
1.0000 | ORAL_TABLET | Freq: Every day | ORAL | Status: DC
  Administered 2018-01-29: 1 via ORAL
  Filled 2018-01-28: qty 1

## 2018-01-28 MED ORDER — EPHEDRINE 5 MG/ML INJ
INTRAVENOUS | Status: AC
  Filled 2018-01-28: qty 10

## 2018-01-28 MED ORDER — ACETAMINOPHEN 325 MG PO TABS: 325.0000 mg | ORAL_TABLET | ORAL | Status: DC | PRN

## 2018-01-28 MED ORDER — FLEET ENEMA 7-19 GM/118ML RE ENEM: 1.0000 | ENEMA | RECTAL | Status: DC | PRN

## 2018-01-28 MED ORDER — ONDANSETRON HCL 4 MG/2ML IJ SOLN
INTRAMUSCULAR | Status: AC
  Filled 2018-01-28: qty 2

## 2018-01-28 MED ORDER — ONDANSETRON HCL 4 MG/2ML IJ SOLN: 4.0000 mg | Freq: Four times a day (QID) | INTRAMUSCULAR | Status: DC | PRN

## 2018-01-28 MED ORDER — EPHEDRINE SULFATE 50 MG/ML IJ SOLN
INTRAMUSCULAR | Status: DC | PRN
  Administered 2018-01-28: 5 mg via INTRAVENOUS

## 2018-01-28 MED ORDER — TETANUS-DIPHTH-ACELL PERTUSSIS 5-2.5-18.5 LF-MCG/0.5 IM SUSP: 0.5000 mL | Freq: Once | INTRAMUSCULAR | Status: DC

## 2018-01-28 MED ORDER — LACTATED RINGERS IV SOLN: 500.0000 mL | INTRAVENOUS | Status: DC | PRN

## 2018-01-28 MED ORDER — ONDANSETRON HCL 4 MG/2ML IJ SOLN: 4.0000 mg | INTRAMUSCULAR | Status: DC | PRN

## 2018-01-28 MED ORDER — WITCH HAZEL-GLYCERIN EX PADS: 1.0000 "application " | MEDICATED_PAD | CUTANEOUS | Status: DC | PRN

## 2018-01-28 MED ORDER — SENNOSIDES-DOCUSATE SODIUM 8.6-50 MG PO TABS
2.0000 | ORAL_TABLET | ORAL | Status: DC
  Administered 2018-01-28 – 2018-01-30 (×2): 2 via ORAL
  Filled 2018-01-28 (×2): qty 2

## 2018-01-28 MED ORDER — LACTATED RINGERS IV SOLN
INTRAVENOUS | Status: DC
  Administered 2018-01-28: 13:00:00 via INTRAVENOUS

## 2018-01-28 MED ORDER — ACETAMINOPHEN 325 MG PO TABS
650.0000 mg | ORAL_TABLET | ORAL | Status: DC | PRN
  Administered 2018-01-28 – 2018-01-30 (×3): 650 mg via ORAL
  Filled 2018-01-28 (×5): qty 2

## 2018-01-28 MED ORDER — LIDOCAINE HCL (PF) 1 % IJ SOLN
30.0000 mL | INTRAMUSCULAR | Status: DC | PRN
  Filled 2018-01-28: qty 30

## 2018-01-28 MED ORDER — COCONUT OIL OIL
1.0000 "application " | TOPICAL_OIL | Status: DC | PRN
  Filled 2018-01-28: qty 120

## 2018-01-28 MED ORDER — OXYCODONE-ACETAMINOPHEN 5-325 MG PO TABS: 2.0000 | ORAL_TABLET | ORAL | Status: DC | PRN

## 2018-01-28 MED ORDER — FENTANYL CITRATE (PF) 100 MCG/2ML IJ SOLN
25.0000 ug | INTRAMUSCULAR | Status: DC | PRN
  Administered 2018-01-28: 25 ug via INTRAVENOUS

## 2018-01-28 MED ORDER — MIDAZOLAM HCL 2 MG/2ML IJ SOLN
INTRAMUSCULAR | Status: DC | PRN
  Administered 2018-01-28: 2 mg via INTRAVENOUS

## 2018-01-28 MED ORDER — SOD CITRATE-CITRIC ACID 500-334 MG/5ML PO SOLN
30.0000 mL | ORAL | Status: DC | PRN
  Administered 2018-01-28: 30 mL via ORAL
  Filled 2018-01-28: qty 15

## 2018-01-28 MED ORDER — FENTANYL CITRATE (PF) 100 MCG/2ML IJ SOLN
100.0000 ug | INTRAMUSCULAR | Status: DC | PRN
  Administered 2018-01-28 (×2): 100 ug via INTRAVENOUS
  Filled 2018-01-28 (×2): qty 2

## 2018-01-28 MED ORDER — DIPHENHYDRAMINE HCL 25 MG PO CAPS: 25.0000 mg | ORAL_CAPSULE | Freq: Four times a day (QID) | ORAL | Status: DC | PRN

## 2018-01-28 MED ORDER — FAMOTIDINE 20 MG PO TABS
40.0000 mg | ORAL_TABLET | Freq: Once | ORAL | Status: AC
  Administered 2018-01-28: 40 mg via ORAL
  Filled 2018-01-28: qty 2

## 2018-01-28 MED ORDER — ZOLPIDEM TARTRATE 5 MG PO TABS: 5.0000 mg | ORAL_TABLET | Freq: Every evening | ORAL | Status: DC | PRN

## 2018-01-28 MED ORDER — OXYTOCIN 40 UNITS IN LACTATED RINGERS INFUSION - SIMPLE MED
2.5000 [IU]/h | INTRAVENOUS | Status: DC
  Administered 2018-01-28: 2.5 [IU]/h via INTRAVENOUS
  Filled 2018-01-28: qty 1000

## 2018-01-28 MED ORDER — MEPERIDINE HCL 25 MG/ML IJ SOLN: 6.2500 mg | INTRAMUSCULAR | Status: DC | PRN

## 2018-01-28 MED ORDER — OXYTOCIN BOLUS FROM INFUSION
500.0000 mL | Freq: Once | INTRAVENOUS | Status: AC
  Administered 2018-01-28: 500 mL via INTRAVENOUS

## 2018-01-28 MED ORDER — MIDAZOLAM HCL 2 MG/2ML IJ SOLN
INTRAMUSCULAR | Status: AC
  Filled 2018-01-28: qty 2

## 2018-01-28 MED ORDER — BUPIVACAINE HCL (PF) 0.25 % IJ SOLN
INTRAMUSCULAR | Status: DC | PRN
  Administered 2018-01-28: 10 mL

## 2018-01-28 MED ORDER — DIBUCAINE 1 % RE OINT: 1.0000 "application " | TOPICAL_OINTMENT | RECTAL | Status: DC | PRN

## 2018-01-28 MED ORDER — MISOPROSTOL 25 MCG QUARTER TABLET: 25.0000 ug | ORAL_TABLET | ORAL | Status: DC | PRN

## 2018-01-28 MED ORDER — BUPIVACAINE IN DEXTROSE 0.75-8.25 % IT SOLN
INTRATHECAL | Status: DC | PRN
  Administered 2018-01-28: 1.1 mL via INTRATHECAL

## 2018-01-28 MED ORDER — ONDANSETRON HCL 4 MG/2ML IJ SOLN: 4.0000 mg | Freq: Once | INTRAMUSCULAR | Status: DC | PRN

## 2018-01-28 MED ORDER — OXYCODONE HCL 5 MG PO TABS
10.0000 mg | ORAL_TABLET | ORAL | Status: DC | PRN
  Administered 2018-01-28 – 2018-01-29 (×5): 10 mg via ORAL
  Filled 2018-01-28 (×6): qty 2

## 2018-01-28 MED ORDER — FENTANYL CITRATE (PF) 100 MCG/2ML IJ SOLN
INTRAMUSCULAR | Status: AC
  Filled 2018-01-28: qty 2

## 2018-01-28 MED ORDER — TERBUTALINE SULFATE 1 MG/ML IJ SOLN
0.2500 mg | Freq: Once | INTRAMUSCULAR | Status: DC | PRN
  Filled 2018-01-28: qty 1

## 2018-01-28 MED ORDER — LACTATED RINGERS IV SOLN
INTRAVENOUS | Status: DC
  Administered 2018-01-28 (×2): via INTRAVENOUS

## 2018-01-28 MED ORDER — IBUPROFEN 600 MG PO TABS
600.0000 mg | ORAL_TABLET | Freq: Four times a day (QID) | ORAL | Status: DC
  Administered 2018-01-28 – 2018-01-30 (×8): 600 mg via ORAL
  Filled 2018-01-28 (×8): qty 1

## 2018-01-28 MED ORDER — OXYCODONE HCL 5 MG PO TABS: 5.0000 mg | ORAL_TABLET | Freq: Once | ORAL | Status: DC | PRN

## 2018-01-28 MED ORDER — BENZOCAINE-MENTHOL 20-0.5 % EX AERO: 1.0000 "application " | INHALATION_SPRAY | CUTANEOUS | Status: DC | PRN

## 2018-01-28 MED ORDER — METOCLOPRAMIDE HCL 10 MG PO TABS
10.0000 mg | ORAL_TABLET | Freq: Once | ORAL | Status: AC
  Administered 2018-01-28: 10 mg via ORAL
  Filled 2018-01-28: qty 1

## 2018-01-28 MED ORDER — ACETAMINOPHEN 160 MG/5ML PO SOLN: 325.0000 mg | ORAL | Status: DC | PRN

## 2018-01-28 MED ORDER — ACETAMINOPHEN 325 MG PO TABS
650.0000 mg | ORAL_TABLET | ORAL | Status: DC | PRN
  Administered 2018-01-28: 650 mg via ORAL
  Filled 2018-01-28: qty 2

## 2018-01-28 MED ORDER — ONDANSETRON HCL 4 MG/2ML IJ SOLN
INTRAMUSCULAR | Status: DC | PRN
  Administered 2018-01-28: 4 mg via INTRAVENOUS

## 2018-01-28 SURGICAL SUPPLY — 14 items
BENZOIN TINCTURE PRP APPL 2/3 (GAUZE/BANDAGES/DRESSINGS) ×3 IMPLANT
CLOSURE WOUND 1/2 X4 (GAUZE/BANDAGES/DRESSINGS) ×1
DRSG OPSITE POSTOP 3X4 (GAUZE/BANDAGES/DRESSINGS) ×3 IMPLANT
GLOVE BIOGEL PI IND STRL 6.5 (GLOVE) ×1 IMPLANT
GLOVE BIOGEL PI IND STRL 7.0 (GLOVE) ×3 IMPLANT
GLOVE BIOGEL PI IND STRL 7.5 (GLOVE) ×1 IMPLANT
GLOVE BIOGEL PI INDICATOR 6.5 (GLOVE) ×2
GLOVE BIOGEL PI INDICATOR 7.0 (GLOVE) ×6
GLOVE BIOGEL PI INDICATOR 7.5 (GLOVE) ×2
GLOVE ECLIPSE 7.5 STRL STRAW (GLOVE) ×3 IMPLANT
PACK ABDOMINAL MINOR (CUSTOM PROCEDURE TRAY) ×3 IMPLANT
SPONGE LAP 4X18 RFD (DISPOSABLE) ×3 IMPLANT
STRIP CLOSURE SKIN 1/2X4 (GAUZE/BANDAGES/DRESSINGS) ×2 IMPLANT
SYR CONTROL 10ML LL (SYRINGE) ×3 IMPLANT

## 2018-01-28 NOTE — Transfer of Care (Signed)
Immediate Anesthesia Transfer of Care Note  Patient: Rebecca Nash  Procedure(s) Performed: POST PARTUM TUBAL LIGATION (Bilateral )  Patient Location: PACU  Anesthesia Type:Spinal  Level of Consciousness: awake, alert  and oriented  Airway & Oxygen Therapy: Patient Spontanous Breathing  Post-op Assessment: Report given to RN and Post -op Vital signs reviewed and stable  Post vital signs: Reviewed and stable  Last Vitals:  Vitals:   01/28/18 1200 01/28/18 1329  BP: (!) 92/46 (!) 103/59  Pulse: 73 (!) 56  Resp: 18 16  Temp: 36.6 C 36.8 C    Last Pain:  Vitals:   01/28/18 1330  TempSrc:   PainSc: 2       Patients Stated Pain Goal: 4 (01/28/18 1156)  Complications: No apparent anesthesia complications

## 2018-01-28 NOTE — Progress Notes (Signed)
Labor Progress Note Rebecca Nash is a 27 y.o. G3P2002 at [redacted]w[redacted]d presented for postdates IOL  S:  Patient reporting more intense contractions since foley bulb out.   O:  BP 112/86   Pulse 95   Temp 98.1 F (36.7 C) (Oral)   Resp 16   Ht 5' (1.524 m)   Wt 167 lb 12.8 oz (76.1 kg)   LMP 04/15/2017   BMI 32.77 kg/m   Fetal Tracing:  Baseline: 145 Variability: moderate Accels: 15x15 Decels: variable  Toco: 1-4  CVE: Dilation: 6 Effacement (%): 60 Station: -3 Presentation: Vertex Exam by:: Druscilla BrownieNeill CNM  A&P: 27 y.o. [id number] 306w1d postdates IOL #Labor: Progressing well. Expectant management. Will AROM if no change in 2 hours. #Pain: IV pain medication #FWB: Cat 1 #GBS negative  Rolm Bookbinderaroline M Neill, CNM 9:34 AM

## 2018-01-28 NOTE — Anesthesia Pain Management Evaluation Note (Signed)
  CRNA Pain Management Visit Note  Patient: Rebecca Nash, 27 y.o., female  "Hello I am a member of the anesthesia team at [facility]. We have an anesthesia team available at all times to provide care throughout the hospital, including epidural management and anesthesia for C-section. I don't know your plan for the delivery whether it a natural birth, water birth, IV sedation, nitrous supplementation, doula or epidural, but we want to meet your pain goals."   1.Was your pain managed to your expectations on prior hospitalizations?   No   2.What is your expectation for pain management during this hospitalization?     IV pain meds  3.How can we help you reach that goal? IV pain rx  Record the patient's initial score and the patient's pain goal.   Pain: 10  Pain Goal: 10 [facility] wants you to be able to say your pain was always managed very well.  Rebecca Nash 01/28/2018

## 2018-01-28 NOTE — Anesthesia Preprocedure Evaluation (Signed)
Anesthesia Evaluation  Patient identified by MRN, date of birth, ID band Patient awake    Reviewed: Allergy & Precautions, H&P , NPO status , Patient's Chart, lab work & pertinent test results  Airway Mallampati: II  TM Distance: >3 FB Neck ROM: full    Dental   Pulmonary neg pulmonary ROS,    breath sounds clear to auscultation       Cardiovascular negative cardio ROS   Rhythm:regular Rate:Normal     Neuro/Psych negative neurological ROS  negative psych ROS   GI/Hepatic negative GI ROS, Neg liver ROS,   Endo/Other  negative endocrine ROS  Renal/GU negative Renal ROS  negative genitourinary   Musculoskeletal negative musculoskeletal ROS (+)   Abdominal   Peds negative pediatric ROS (+)  Hematology negative hematology ROS (+)   Anesthesia Other Findings   Reproductive/Obstetrics negative OB ROS                             Anesthesia Physical  Anesthesia Plan  ASA: II  Anesthesia Plan: Spinal   Post-op Pain Management:    Induction:   PONV Risk Score and Plan: 2 and Treatment may vary due to age or medical condition  Airway Management Planned: Nasal Cannula and Natural Airway  Additional Equipment:   Intra-op Plan:   Post-operative Plan:   Informed Consent: I have reviewed the patients History and Physical, chart, labs and discussed the procedure including the risks, benefits and alternatives for the proposed anesthesia with the patient or authorized representative who has indicated his/her understanding and acceptance.     Plan Discussed with: CRNA, Anesthesiologist and Surgeon  Anesthesia Plan Comments: (  )        Anesthesia Quick Evaluation

## 2018-01-28 NOTE — Op Note (Signed)
NAME@ 01/28/2018  PREOPERATIVE DIAGNOSIS:  Undesired fertility  POSTOPERATIVE DIAGNOSIS:  Undesired fertility  PROCEDURE:  Postpartum Bilateral Tubal Sterilization using Pomeroy method   ANESTHESIA:  Epidural  COMPLICATIONS:  None immediate.  ESTIMATED BLOOD LOSS:  Less than 20cc.  FLUIDS: 900 cc LR.  URINE OUTPUT:  150 cc of clear urine.  INDICATIONS: 27 y.o. yo [phone]  with undesired fertility,status post vaginal delivery, desires permanent sterilization. Risks and benefits of procedure discussed with patient including permanence of method, bleeding, infection, injury to surrounding organs and need for additional procedures. Risk failure of 0.5-1% with increased risk of ectopic gestation if pregnancy occurs was also discussed with patient.   FINDINGS:  Normal uterus, tubes, and ovaries.  TECHNIQUE: After informed consent was obtained, the patient was taken to the operating room where anesthesia was induced and found to be adequate. A small transverse, infraumbilical skin incision was made with the scalpel. This incision was carried down to the underlying layer of fascia. The fascia was grasped with Allis clamps tented up and entered sharply with Mayo scissors. Underlying peritoneum was then identified tented up and entered bluntly. The patient's left fallopian tube was then identified, brought to the incision, and grasped with a Babcock clamp. The tube was then followed out to the fimbria. The Babcock clamp was then used to grasp the tube approximately 4 cm from the cornual region. A 3 cm segment of the tube was then ligated with two free ties of 0 plain gut suture, transected and excised. Good hemostasis was noted and the tube was returned to the abdomen. The right fallopian tube was then identified to its fimbriated end, ligated, and a 3 cm segment excised in a similar fashion. Excellent hemostasis was noted, and the tube returned to the abdomen. The fascia was re-approximated with 0  Vicryl. The skin was closed in a subcuticular fashion with 4-0 Vicryl. 10 ml of 0.25% Marcaine solution was then injected at the incision site. The patient tolerated the procedure well. Sponge, lap, and needle count were correct x2. The patient was taken to recovery room in stable condition.

## 2018-01-28 NOTE — Anesthesia Procedure Notes (Signed)
Spinal  Patient location during procedure: OR Start time: 01/28/2018 2:33 PM End time: 01/28/2018 2:37 PM Staffing Anesthesiologist: Bethena Midgetddono, Keone Kamer, MD Preanesthetic Checklist Completed: patient identified, site marked, surgical consent, pre-op evaluation, timeout performed, IV checked, risks and benefits discussed and monitors and equipment checked Spinal Block Patient position: sitting Prep: DuraPrep Patient monitoring: heart rate, cardiac monitor, continuous pulse ox and blood pressure Approach: midline Location: L3-4 Injection technique: single-shot Needle Needle type: Sprotte  Needle gauge: 24 G Needle length: 9 cm Assessment Sensory level: T4

## 2018-01-28 NOTE — H&P (Signed)
OBSTETRIC ADMISSION HISTORY AND PHYSICAL  Nikka Hakimian is a 27 y.o. female G3P2002 with IUP at [redacted]w[redacted]d by LMP presenting for fetal induction of labor for post date pregnancy. She reports +FMs, No LOF, no VB, no blurry vision, headaches or peripheral edema, and RUQ pain.  She plans on breast feeding. She request BTL for birth control. She [facility]   Dating: By LMP --->  Estimated Date of Delivery: 01/20/18  Sono:    @[redacted]w[redacted]d , CWD, normal anatomy, breech position presentation, 324.99g, 52.8% EFW  Prenatal History/Complications: None  Past Medical History: Past Medical History:  Diagnosis Date  . Depression    was on meds prior to moving here post partum  . Late prenatal care     Past Surgical History: Past Surgical History:  Procedure Laterality Date  . NO PAST SURGERIES      Obstetrical History: OB History    Gravida Para Term Preterm AB Living   3 2 2  0 0 2   SAB TAB Ectopic Multiple Live Births   0 0 0 0 2      Social History: Social History   Socioeconomic History  . Marital status: Married    Spouse name: Not on file  . Number of children: Not on file  . Years of education: Not on file  . Highest education level: Not on file  Social Needs  . Financial resource strain: Not on file  . Food insecurity - worry: Not on file  . Food insecurity - inability: Not on file  . Transportation needs - medical: Not on file  . Transportation needs - non-medical: Not on file  Occupational History  . Not on file  Tobacco Use  . Smoking status: Never Smoker  . Smokeless tobacco: Never Used  Substance and Sexual Activity  . Alcohol use: No  . Drug use: No  . Sexual activity: Yes    Birth control/protection: None  Other Topics Concern  . Not on file  Social History Narrative  . Not on file    Family History: Family History  Problem Relation Age of Onset  . Other Other        Moebius syndrome  . Hypertension Mother   . Anemia Daughter         blood transfusion at 10mos  . Vision loss Daughter        eye surgery  . Other Daughter        Sotos syndrome    Allergies: No Known Allergies  Medications Prior to Admission  Medication Sig Dispense Refill Last Dose  . Acetaminophen (TYLENOL PO) Take by mouth.   Taking  . ibuprofen (ADVIL,MOTRIN) 600 MG tablet Take 1 tablet (600 mg total) by mouth every 6 (six) hours as needed for mild pain, moderate pain or cramping. (Patient not taking: Reported on 05/12/2016) 30 tablet 0 Not Taking at Unknown time  . Prenatal Vit w/Fe-Methylfol-FA (PNV PO) Take by mouth.   Taking  . Prenatal Vit-Fe Fumarate-FA (PRENATAL MULTIVITAMIN) TABS tablet Take 1 tablet by mouth daily at 12 noon. (Patient not taking: Reported on 05/12/2016) 30 tablet 12 Not Taking at Unknown time     Review of Systems   All systems reviewed and negative except as stated in HPI  Blood pressure 116/67, pulse 92, resp. rate 18, last menstrual period 04/15/2017, unknown if currently breastfeeding. General appearance: alert, cooperative and no distress Lungs: clear to auscultation bilaterally Heart: regular rate and rhythm Abdomen: soft, non-tender; bowel sounds  normal Pelvic: normal Extremities: Homans sign is negative, no sign of DVT Presentation: cephalic Fetal monitoringBaseline: 135 bpm, Variability: Good {> 6 bpm), Accelerations: Reactive and Decelerations: Absent Uterine activityFrequency: Every 6-8 minutes Dilation: Fingertip Effacement (%): 50 Station: -3 Exam by:: Dr. Doroteo GlassmanPhelps   Prenatal labs: ABO, Rh: A/Positive/-- (08/23 0000) Antibody: Negative (08/23 0000) Rubella: Immune (08/23 0000) RPR: Nonreactive (08/23 0000)  HBsAg: Negative (08/23 0000)  HIV: Non-reactive (08/23 0000)  GBS: Negative (02/06 0000)  1 hr Glucola failed 157, passed 3hr GTT Genetic screening:CF Negative Anatomy US: No anatomic abnormalities identified  Prenatal Transfer Tool  Maternal Diabetes: No Genetic Screening:  Normal Maternal Ultrasounds/Referrals: Normal Fetal Ultrasounds or other Referrals:  None Maternal Substance Abuse:  No Significant Maternal Medications:  None Significant Maternal Lab Results: Lab values include: Group B Strep negative  No results found for this or any previous visit (from the past 24 hour(s)).  Patient Active Problem List   Diagnosis Date Noted  . [redacted] weeks gestation of pregnancy   . Active labor 11/26/2013  . Family history of congenital anomalies 08/10/2013  . Family history of genetic disorder 08/10/2013    Assessment/Plan:  Rosanne SackKeybi Galeano is a 27 y.o. G3P2002 at 7174w1d here maternal induction of labor for post date.  #Labor: Induction of labor with cytotec #Pain: None #FWB: Category 1 #ID: GBS negative, GC/Chlamydia negative #MOF: Breast #MOC:BTL, consent signed in chart. #Circ: N/A  Sharyon CableAaron Wooten, PA-S contributed to note.   Caryl AdaJazma Phelps, DO  01/28/2018, 12:15 AM

## 2018-01-28 NOTE — Progress Notes (Signed)
In- house spanish interpreter, Eda, at bedside.  Plan of care reviewed with patient and support person. All questions answered at this time.

## 2018-01-28 NOTE — Progress Notes (Signed)
Patient desires permanent sterilization.  Other reversible forms of contraception were discussed with patient; she declines all other modalities. Risks of procedure discussed with patient including but not limited to: risk of regret, permanence of method, bleeding, infection, injury to surrounding organs and need for additional procedures.  Failure risk of 1-2 % with increased risk of ectopic gestation if pregnancy occurs was also discussed with patient.  Patient verbalized understanding of these risks and wants to proceed with sterilization.  Written informed consent obtained.  To OR when ready.  Mild thrombocytopenia noted. Also present during pregnancy in 2015. No other s/s hypertensive disorder/hellp.

## 2018-01-28 NOTE — Progress Notes (Signed)
In-house spanish interpreter, Eda, at bedside.  Pt agreeable to AROM.  Interpreter remained at bedside through delivery.  Reviewed plan of care postpartum and tubal ligation.  All questions by patient and family answered.

## 2018-01-28 NOTE — Anesthesia Postprocedure Evaluation (Signed)
Anesthesia Post Note  Patient: Rosanne SackKeybi Galeano  Procedure(s) Performed: POST PARTUM TUBAL LIGATION (Bilateral )     Patient location during evaluation: PACU Anesthesia Type: Spinal Level of consciousness: oriented and awake and alert Pain management: pain level controlled Vital Signs Assessment: post-procedure vital signs reviewed and stable Respiratory status: spontaneous breathing, respiratory function stable and patient connected to nasal cannula oxygen Cardiovascular status: blood pressure returned to baseline and stable Postop Assessment: no headache, no backache and no apparent nausea or vomiting Anesthetic complications: no    Last Vitals:  Vitals:   01/28/18 1647 01/28/18 1802  BP: 97/61 116/68  Pulse: 78 72  Resp: 17 18  Temp: 36.7 C (!) 36.3 C  SpO2: 97%     Last Pain:  Vitals:   01/28/18 1802  TempSrc: Oral  PainSc: 7    Pain Goal: Patients Stated Pain Goal: 4 (01/28/18 1156)               Kyan Yurkovich

## 2018-01-28 NOTE — Progress Notes (Signed)
Rebecca Nash is a 27 y.o. G3P2002 at [redacted]w[redacted]d by LMP admitted for induction of labor due to Post dates. Due date 01/20/2018.  Subjective: Pt reports that she is doing well. Pain is currently 4/10 but manageable. She declined pain medication at this time.  Objective: BP (!) 93/52   Pulse 98   Temp 98.1 F (36.7 C) (Oral)   Resp 18   Ht 5' (1.524 m)   Wt 76.1 kg (167 lb 12.8 oz)   LMP 04/15/2017   BMI 32.77 kg/m  No intake/output data recorded. No intake/output data recorded.  FHT:  FHR: 155 bpm, variability: moderate,  accelerations:  Present,  decelerations:  Absent UC:   regular, every 3-5 minutes SVE:   Dilation: Fingertip Effacement (%): 60 Station: -3 Exam by:: [employer] RN  Labs: Lab Results  Component Value Date   WBC 10.5 01/28/2018   HGB 13.1 01/28/2018   HCT 38.7 01/28/2018   MCV 89.0 01/28/2018   PLT 129 (L) 01/28/2018    Assessment / Plan: Induction of labor due to postterm, Not in labor.  Labor: Continue on Cytotec for induction, Placed foley bulb to help with induction Preeclampsia:  N/A Fetal Wellbeing:  Category I Pain Control:  Labor support without medications I/D:  n/a Anticipated MOD:  NSVD  Anitra Lauth PA-Student 01/28/2018, 5:52 AM  OB FELLOW PA STUDENT NOTE ATTESTATION  I confirm that I have verified the information documented in the PA student's note and that I have also personally reperformed the physical exam and all medical decision making activities.   Caryl Ada, DO 01/28/2018, 6:12 AM

## 2018-01-29 LAB — CBC
HCT: 33.8 % — ABNORMAL LOW (ref 36.0–46.0)
Hemoglobin: 11.5 g/dL — ABNORMAL LOW (ref 12.0–15.0)
MCH: 30.2 pg (ref 26.0–34.0)
MCHC: 34 g/dL (ref 30.0–36.0)
MCV: 88.7 fL (ref 78.0–100.0)
Platelets: 122 10*3/uL — ABNORMAL LOW (ref 150–400)
RBC: 3.81 MIL/uL — ABNORMAL LOW (ref 3.87–5.11)
RDW: 13.5 % (ref 11.5–15.5)
WBC: 10.4 10*3/uL (ref 4.0–10.5)

## 2018-01-29 NOTE — Anesthesia Postprocedure Evaluation (Signed)
Anesthesia Post Note  Patient: Rebecca Nash  Procedure(s) Performed: POST PARTUM TUBAL LIGATION (Bilateral )     Patient location during evaluation: Mother Baby Anesthesia Type: Spinal Level of consciousness: oriented and awake and alert Pain management: pain level controlled Vital Signs Assessment: post-procedure vital signs reviewed and stable Respiratory status: spontaneous breathing and respiratory function stable Cardiovascular status: blood pressure returned to baseline and stable Postop Assessment: no headache, no backache and no apparent nausea or vomiting Anesthetic complications: no    Last Vitals:  Vitals:   01/29/18 0145 01/29/18 0530  BP: (!) 107/56 (!) 96/56  Pulse: 72 70  Resp: 18 18  Temp: 36.9 C 36.6 C  SpO2: 100% 97%    Last Pain:  Vitals:   01/29/18 0634  TempSrc:   PainSc: 0-No pain   Pain Goal: Patients Stated Pain Goal: 4 (01/28/18 1156)               Junious SilkGILBERT,Geovannie Vilar

## 2018-01-29 NOTE — Progress Notes (Signed)
MOB was referred for history of depression/anxiety. * Referral screened out by Clinical Social Worker because none of the following criteria appear to apply: ~ History of anxiety/depression during this pregnancy, or of post-partum depression. ~ Diagnosis of anxiety and/or depression within last 3 years OR * MOB's symptoms currently being treated with medication and/or therapy. Please contact the Clinical Social Worker if needs arise, by MOB request, or if MOB scores greater than 9/yes to question 10 on Edinburgh Postpartum Depression Screen.  Jazmene Racz Boyd-Gilyard, MSW, LCSW Clinical Social Work [phone] 

## 2018-01-29 NOTE — Lactation Note (Signed)
This note was copied from a baby's chart. Lactation Consultation Note Baby 16 hrs old at time of consult.  [employer] interpreter used Ronaldo [phone] used. Mom grimacing holding stomach. Mom stated she was in a lot of pain cramping in her stomach, and worse when BF, she can't do it no more tonight until pain get better. Mom starts crying.  LC calls for RN. RN brings medication. Explained why stomach cramping. LC asked mom if she BF her other children, stated yes her 3,and 34 yr old for 1 yr each no problem.  Mom showed LC her breast, has good everted nipples, hand expressed colostrum easily.  Mom asked for formula for tonight d/t pain. Discussed understanding pain and reason why didn't want to BF right now. Encouraged to BF for next feeding when medication has helped not to cut down milk supply. Mom stated she didn't think she had enough milk for baby, LC reminded of easy flow of colostrum.  LEAD discussed. Formula given and explained formula feeding baby w/information sheet on amount. Encouraged mom to call for assistance BF. Mom stated baby latching good, wants breast all the time. Newborn behavior and feeding habits discussed.  WH/LC brochure given w/resources, support groups and LC services.  Patient Name: Rebecca Nash Reason for consult: Initial assessment   Maternal Data Has patient been taught Hand Expression?: Yes Does the patient have breastfeeding experience prior to this delivery?: Yes  Feeding Feeding Type: Breast Fed Length of feed: 10 min  LATCH Score Latch: Grasps breast easily, tongue down, lips flanged, rhythmical sucking.  Audible Swallowing: A few with stimulation  Type of Nipple: Everted at rest and after stimulation  Comfort (Breast/Nipple): Soft / non-tender  Hold (Positioning): No assistance needed to correctly position infant at breast.  LATCH Score: 9  Interventions Interventions: Breast feeding basics  reviewed;Support pillows;Hand express;Breast compression  Lactation Tools Discussed/Used WIC Program: Yes   Consult Status Consult Status: Follow-up Date: 01/30/18 Follow-up type: In-patient    Rebecca DancerCARVER, Hala Narula G Nash, 4:27 AM

## 2018-01-29 NOTE — Addendum Note (Signed)
Addendum  created 01/29/18 0737 by Shariece Viveiros, CRNA   Sign clinical note    

## 2018-01-29 NOTE — Progress Notes (Signed)
Post Partum Day 1 Subjective: up ad lib, voiding and tolerating PO. Complaining of pain from BTL.   Objective: Blood pressure (!) 96/56, pulse 70, temperature 97.8 F (36.6 C), temperature source Oral, resp. rate 18, height 5' (1.524 m), weight 76.1 kg (167 lb 12.8 oz), last menstrual period 04/15/2017, SpO2 97 %, unknown if currently breastfeeding.  Physical Exam:  General: alert, cooperative and no distress Lochia: appropriate Uterine Fundus: firm Incision: no significant drainage DVT Evaluation: No evidence of DVT seen on physical exam. No significant calf/ankle edema.  Recent Labs    01/28/18 0049 01/29/18 0620  HGB 13.1 11.5*  HCT 38.7 33.8*    Assessment/Plan: Plan for discharge tomorrow and Breastfeeding   LOS: 1 day   Caryl AdaJazma Phelps, DO 01/29/2018, 10:14 AM

## 2018-01-30 ENCOUNTER — Encounter: Payer: Self-pay | Admitting: Obstetrics & Gynecology

## 2018-01-30 DIAGNOSIS — Z8759 Personal history of other complications of pregnancy, childbirth and the puerperium: Secondary | ICD-10-CM | POA: Insufficient documentation

## 2018-01-30 MED ORDER — IBUPROFEN 600 MG PO TABS: 600.0000 mg | ORAL_TABLET | Freq: Four times a day (QID) | ORAL | 0 refills | Status: DC

## 2018-01-30 NOTE — Lactation Note (Signed)
This note was copied from a baby's chart. Lactation Consultation Note  Patient Name: Rebecca Nash   Visited with Mom on day of discharge, baby 546 hrs old.  Baby at 6% weight loss.  Mom choosing to give bottles along with breastfeeding.  Pacifier noted in bed.  Mom wearing Comfort Gels due to soreness on nipples.  Mom has rather large diameter nipples, and knows importance of baby opening her mouth widely before bringing her onto breast for latch.  Mom encouraged to break suction before taking baby off the breast.   Encouraged continued STS, with feedings often when baby cues.  Mom aware of goal of 8-12 feedings per 24 hrs.  Mom denies any questions. Mom aware of OP lactation support available to her.  Encouraged her to call prn.  Engorgement prevention and treatment discussed.    Judee ClaraSmith, Shamiracle Gorden E Nash, 8:48 AM

## 2018-01-30 NOTE — Discharge Summary (Signed)
OB Discharge Summary     Patient Name: Rebecca Nash DOB: 04/27/1991 MRN: [id number]  Date of admission: 01/28/2018 Delivering MD: Rolm BookbinderNEILL, Mina Babula M   Date of discharge: 01/30/2018  Admitting diagnosis: INDUCTION Intrauterine pregnancy: 236w1d     Secondary diagnosis:  Active Problems:   Post term pregnancy   Thrombocytopenia (HCC)  Additional problems: n/a     Discharge diagnosis: Term Pregnancy Delivered                                                                                                Post partum procedures:n/a  Augmentation: AROM, Cytotec and Foley Balloon  Complications: Shoulder dystocia  Hospital course:  Induction of Labor With Vaginal Delivery   27 y.o. yo G3P3003 at 196w1d was admitted to the hospital 01/28/2018 for induction of labor.  Indication for induction: Postdates.  Patient had an uncomplicated labor course as follows: Membrane Rupture Time/Date: 10:05 AM ,01/28/2018   Intrapartum Procedures: Episiotomy: None [1]                                         Lacerations:  1st degree [2]  Patient had delivery of a Viable infant.  Information for the patient's newborn:  Linford ArnoldGaleano, Girl Sole [[id number]][[id number]]  Delivery Method: Vaginal, Spontaneous(Filed from Delivery Summary)   01/28/2018  Details of delivery can be found in separate delivery note.  Patient had a routine postpartum course. Patient is discharged home 01/30/18.  Physical exam  Vitals:   01/29/18 0530 01/29/18 1103 01/29/18 1841 01/30/18 0824  BP: (!) 96/56 (!) 103/58 99/65 96/64   Pulse: 70 72 79 89  Resp: 18  18 18   Temp: 97.8 F (36.6 C) 98.2 F (36.8 C) 97.9 F (36.6 C) 98.1 F (36.7 C)  TempSrc: Oral  Oral Oral  SpO2: 97% 99%    Weight:      Height:       General: alert, cooperative and no distress Lochia: appropriate Uterine Fundus: firm Incision: Healing well with no significant drainage DVT Evaluation: No evidence of DVT seen on physical exam. Labs: Lab Results  Component  Value Date   WBC 10.4 01/29/2018   HGB 11.5 (L) 01/29/2018   HCT 33.8 (L) 01/29/2018   MCV 88.7 01/29/2018   PLT 122 (L) 01/29/2018   CMP Latest Ref Rng & Units 05/13/2016  Glucose 65 - 99 mg/dL 83  BUN 6 - 20 mg/dL 13  Creatinine 5.620.44 - 1.301.00 mg/dL 8.650.60  Sodium 784135 - 696145 mmol/L 141  Potassium 3.5 - 5.1 mmol/L 3.6  Chloride 101 - 111 mmol/L 102    Discharge instruction: per After Visit Summary and "Baby and Me Booklet".  After visit meds:  Allergies as of 01/30/2018   No Known Allergies     Medication List    TAKE these medications   ibuprofen 600 MG tablet Commonly known as:  ADVIL,MOTRIN Take 1 tablet (600 mg total) by mouth every 6 (six) hours.   PNV PO Take by mouth.  Diet: routine diet  Activity: Advance as tolerated. Pelvic rest for 6 weeks.   Outpatient follow up:4 weeks Follow up Appt:No future appointments. Follow up Visit:No follow-ups on file.  Postpartum contraception: Tubal Ligation  Newborn Data: Live born female  Birth Weight: 7 lb 14.3 oz (3580 g) APGAR: 7, 9  Newborn Delivery   Birth date/time:  01/28/2018 10:12:00 Delivery type:  Vaginal, Spontaneous     Baby Feeding: Breast Disposition:home with mother  01/30/2018 Rolm Bookbinder, CNM

## 2018-01-30 NOTE — Discharge Instructions (Signed)
Vaginal Delivery, Care After °Refer to this sheet in the next few weeks. These instructions provide you with information about caring for yourself after vaginal delivery. Your health care provider may also give you more specific instructions. Your treatment has been planned according to current medical practices, but problems sometimes occur. Call your health care provider if you have any problems or questions. °What can I expect after the procedure? °After vaginal delivery, it is common to have: °· Some bleeding from your vagina. °· Soreness in your abdomen, your vagina, and the area of skin between your vaginal opening and your anus (perineum). °· Pelvic cramps. °· Fatigue. ° °Follow these instructions at home: °Medicines °· Take over-the-counter and prescription medicines only as told by your health care provider. °· If you were prescribed an antibiotic medicine, take it as told by your health care provider. Do not stop taking the antibiotic until it is finished. °Driving ° °· Do not drive or operate heavy machinery while taking prescription pain medicine. °· Do not drive for 24 hours if you received a sedative. °Lifestyle °· Do not drink alcohol. This is especially important if you are breastfeeding or taking medicine to relieve pain. °· Do not use tobacco products, including cigarettes, chewing tobacco, or e-cigarettes. If you need help quitting, ask your health care provider. °Eating and drinking °· Drink at least 8 eight-ounce glasses of water every day unless you are told not to by your health care provider. If you choose to breastfeed your baby, you may need to drink more water than this. °· Eat high-fiber foods every day. These foods may help prevent or relieve constipation. High-fiber foods include: °? Whole grain cereals and breads. °? Brown rice. °? Beans. °? Fresh fruits and vegetables. °Activity °· Return to your normal activities as told by your health care provider. Ask your health care provider  what activities are safe for you. °· Rest as much as possible. Try to rest or take a nap when your baby is sleeping. °· Do not lift anything that is heavier than your baby or 10 lb (4.5 kg) until your health care provider says that it is safe. °· Talk with your health care provider about when you can engage in sexual activity. This may depend on your: °? Risk of infection. °? Rate of healing. °? Comfort and desire to engage in sexual activity. °Vaginal Care °· If you have an episiotomy or a vaginal tear, check the area every day for signs of infection. Check for: °? More redness, swelling, or pain. °? More fluid or blood. °? Warmth. °? Pus or a bad smell. °· Do not use tampons or douches until your health care provider says this is safe. °· Watch for any blood clots that may pass from your vagina. These may look like clumps of dark red, brown, or black discharge. °General instructions °· Keep your perineum clean and dry as told by your health care provider. °· Wear loose, comfortable clothing. °· Wipe from front to back when you use the toilet. °· Ask your health care provider if you can shower or take a bath. If you had an episiotomy or a perineal tear during labor and delivery, your health care provider may tell you not to take baths for a certain length of time. °· Wear a bra that supports your breasts and fits you well. °· If possible, have someone help you with household activities and help care for your baby for at least a few days after   you leave the hospital. °· Keep all follow-up visits for you and your baby as told by your health care provider. This is important. °Contact a health care provider if: °· You have: °? Vaginal discharge that has a bad smell. °? Difficulty urinating. °? Pain when urinating. °? A sudden increase or decrease in the frequency of your bowel movements. °? More redness, swelling, or pain around your episiotomy or vaginal tear. °? More fluid or blood coming from your episiotomy or  vaginal tear. °? Pus or a bad smell coming from your episiotomy or vaginal tear. °? A fever. °? A rash. °? Little or no interest in activities you used to enjoy. °? Questions about caring for yourself or your baby. °· Your episiotomy or vaginal tear feels warm to the touch. °· Your episiotomy or vaginal tear is separating or does not appear to be healing. °· Your breasts are painful, hard, or turn red. °· You feel unusually sad or worried. °· You feel nauseous or you vomit. °· You pass large blood clots from your vagina. If you pass a blood clot from your vagina, save it to show to your health care provider. Do not flush blood clots down the toilet without having your health care provider look at them. °· You urinate more than usual. °· You are dizzy or light-headed. °· You have not breastfed at all and you have not had a menstrual period for 12 weeks after delivery. °· You have stopped breastfeeding and you have not had a menstrual period for 12 weeks after you stopped breastfeeding. °Get help right away if: °· You have: °? Pain that does not go away or does not get better with medicine. °? Chest pain. °? Difficulty breathing. °? Blurred vision or spots in your vision. °? Thoughts about hurting yourself or your baby. °· You develop pain in your abdomen or in one of your legs. °· You develop a severe headache. °· You faint. °· You bleed from your vagina so much that you fill two sanitary pads in one hour. °This information is not intended to replace advice given to you by your health care provider. Make sure you discuss any questions you have with your health care provider. °Document Released: 10/25/2000 Document Revised: 04/10/2016 Document Reviewed: 11/12/2015 °Elsevier Interactive Patient Education © 2018 Elsevier Inc. ° °

## 2018-08-19 ENCOUNTER — Encounter: Payer: Self-pay | Admitting: Family Medicine

## 2018-08-19 ENCOUNTER — Ambulatory Visit: Payer: Medicaid Other | Attending: Family Medicine | Admitting: Family Medicine

## 2018-08-19 VITALS — BP 95/65 | HR 69 | Temp 97.7°F | Resp 18 | Ht 60.0 in | Wt 150.0 lb

## 2018-08-19 DIAGNOSIS — R102 Pelvic and perineal pain: Secondary | ICD-10-CM | POA: Diagnosis not present

## 2018-08-19 DIAGNOSIS — Z8249 Family history of ischemic heart disease and other diseases of the circulatory system: Secondary | ICD-10-CM | POA: Diagnosis not present

## 2018-08-19 DIAGNOSIS — B354 Tinea corporis: Secondary | ICD-10-CM | POA: Diagnosis not present

## 2018-08-19 DIAGNOSIS — R1024 Suprapubic pain: Secondary | ICD-10-CM

## 2018-08-19 DIAGNOSIS — R739 Hyperglycemia, unspecified: Secondary | ICD-10-CM

## 2018-08-19 DIAGNOSIS — N912 Amenorrhea, unspecified: Secondary | ICD-10-CM | POA: Diagnosis not present

## 2018-08-19 DIAGNOSIS — E010 Iodine-deficiency related diffuse (endemic) goiter: Secondary | ICD-10-CM

## 2018-08-19 DIAGNOSIS — R42 Dizziness and giddiness: Secondary | ICD-10-CM | POA: Diagnosis not present

## 2018-08-19 DIAGNOSIS — F329 Major depressive disorder, single episode, unspecified: Secondary | ICD-10-CM | POA: Diagnosis not present

## 2018-08-19 DIAGNOSIS — R21 Rash and other nonspecific skin eruption: Secondary | ICD-10-CM

## 2018-08-19 LAB — POCT URINALYSIS DIP (CLINITEK)
Bilirubin, UA: NEGATIVE
Blood, UA: NEGATIVE
Glucose, UA: NEGATIVE mg/dL
Ketones, POC UA: NEGATIVE mg/dL
Leukocytes, UA: NEGATIVE
Nitrite, UA: NEGATIVE
POC,PROTEIN,UA: NEGATIVE
Spec Grav, UA: >=1.03 — AB
Urobilinogen, UA: 0.2 U/dL
pH, UA: 6

## 2018-08-19 LAB — POCT GLYCOSYLATED HEMOGLOBIN (HGB A1C): Hemoglobin A1C: 5.2 % (ref 4.0–5.6)

## 2018-08-19 LAB — POCT URINE PREGNANCY: Preg Test, Ur: NEGATIVE

## 2018-08-19 MED ORDER — HYDROCORTISONE 0.5 % EX CREA: 1.0000 "application " | TOPICAL_CREAM | Freq: Two times a day (BID) | CUTANEOUS | 6 refills | Status: DC

## 2018-08-19 MED ORDER — NYSTATIN 100000 UNIT/GM EX CREA: 1.0000 "application " | TOPICAL_CREAM | Freq: Three times a day (TID) | CUTANEOUS | 2 refills | Status: DC

## 2018-08-19 NOTE — Patient Instructions (Signed)
Mareos (Dizziness) Los mareos son un problema muy frecuente. Causan sensacin de inestabilidad o de desvanecimiento. Puede sentir que se va a desmayar. Un mareo puede provocarle una lesin si se tropieza o se cae. Cualquier persona puede marearse, pero los mareos son ms frecuentes en los adultos mayores. Esta afeccin puede tener muchas causas, por ejemplo:  Medicamentos.  Deshidratacin.  Enfermedad. CUIDADOS EN EL HOGAR Estas indicaciones pueden ayudarlo con el trastorno: Comida y bebida  Beba suficiente lquido para mantener el pis (orina) claro o de color amarillo plido. Esto evita la deshidratacin. Trate de beber ms lquidos transparentes, como agua.  No beba alcohol.  Limite la cantidad de cafena que bebe o come si el mdico se lo indic.  Limite la cantidad de sal que bebe o come si el mdico se lo indic. Actividad  Evite los movimientos rpidos. ? Cuando se levante de una silla, sujtese hasta sentirse bien. ? Por la maana, sintese primero a un lado de la cama. Cuando se sienta bien, pngase lentamente de pie mientras se sostiene de algo, hasta que sepa que ha logrado el equilibrio.  Mueva las piernas con frecuencia si debe estar de pie en un lugar durante mucho tiempo. Mientras est de pie, contraiga y relaje los msculos de las piernas.  No conduzca vehculos ni utilice maquinarias pesadas si se siente mareado.  Evite agacharse si se siente mareado. En su casa, coloque los objetos de modo que le resulte fcil alcanzarlos sin agacharse. Estilo de vida  No consuma ningn producto que contenga tabaco, lo que incluye cigarrillos, tabaco de mascar o cigarrillos electrnicos. Si necesita ayuda para dejar de fumar, consulte al mdico.  Trate de reducir el nivel de estrs practicando actividades como el yoga o la meditacin. Hable con el mdico si necesita ayuda. Instrucciones generales  Controle sus mareos para ver si hay cambios.  Tome los medicamentos solamente  como se lo haya indicado el mdico. Hable con el mdico si cree que algn medicamento que est tomando es la causa de sus mareos.  Infrmele a un amigo o a un familiar si se siente mareado. Pdale a esta persona que llame al mdico si observa cambios en su comportamiento.  Concurra a todas las visitas de control como se lo haya indicado el mdico. Esto es importante. SOLICITE AYUDA SI:  Los mareos persisten.  Los mareos o la sensacin de desvanecimiento empeoran.  Siente malestar estomacal (nuseas).  Tiene problemas para escuchar.  Aparecen nuevos sntomas.  Cuando est de pie se siente inestable o que la habitacin da vueltas.  SOLICITE AYUDA DE INMEDIATO SI:  Vomita o tiene diarrea y no puede comer ni beber nada.  Tiene dificultad para lo siguiente: ? Hablar. ? Caminar. ? Tragar. ? Usar los brazos, las manos o las piernas.  Siente una debilidad generalizada.  No piensa con claridad o tiene dificultades para armar oraciones. Es posible que un amigo o un familiar adviertan que esto ocurre.  Tiene los siguientes sntomas: ? Dolor en el pecho. ? Dolor en el vientre (abdomen). ? Falta de aire. ? Sudoracin.  Cambios en la visin.  Hemorragias.  Dolores de cabeza.  Dolor o rigidez en el cuello.  Fiebre.  Esta informacin no tiene como fin reemplazar el consejo del mdico. Asegrese de hacerle al mdico cualquier pregunta que tenga. Document Released: 10/17/2011 Document Revised: 03/14/2015 Document Reviewed: 10/24/2014 Elsevier Interactive Patient Education  2017 Elsevier Inc.  

## 2018-08-19 NOTE — Progress Notes (Signed)
Subjective:    Patient ID: Rebecca Nash, female    DOB: 24-Oct-1991, 27 y.o.   MRN: [id number]   Due to a language barrier, patient is accompanied by a live interpreter at today's visit  HPI 27 year old female who is new to the practice.  Patient gave birth approximately 6 months ago.  Patient is currently breast-feeding her daughter.  Patient states that she has not had her period since the birth of her child.  Patient did have her tubes tied after the birth of her child as well.  Patient also with complaint of feeling as if there is some swelling in her neck and sometimes when she lies down at night, patient feels slightly short of breath secondary to the swelling in her neck.  Patient has also noticed a rash beneath the left breast which has recently started.  Patient also with a different rash which started about 2 years ago and a small area beneath her chin on the left.  Patient states that now this is spread to her right upper arm and is also on her leg and other places on her body.  This rash is not itchy but the rash under her breast has been itchy.  Upon questioning, patient states that she has never been told that her blood pressure is low.  Patient's blood pressure at today's visit was 95/65.  Patient does state when questioned that she has had some recent onset of dizziness with changes in position such as going from sitting to standing.Patient does not believe that she has had any recent anemia.  Patient states that she did have elevated blood sugar during her recent pregnancy but was not diagnosed with gestational diabetes.  Patient was told that she needed to watch what she was eating. Patient has also noticed a recent decrease in her ability to see at night or any environment that is not well lighted.  Past Medical History:  Diagnosis Date  . Depression    was on meds prior to moving here post partum  . Late prenatal care    Past Surgical History:  Procedure Laterality Date  . NO  PAST SURGERIES    . TUBAL LIGATION Bilateral 01/28/2018   Procedure: POST PARTUM TUBAL LIGATION;  Surgeon: Kathrynn Running, MD;  Location: [facility] BIRTHING SU[facility];  Service: Gynecology;  Laterality: Bilateral;   Family History  Problem Relation Age of Onset  . Other Other        Moebius syndrome  . Hypertension Mother   . Colon cancer Mother   . Cervical cancer Mother   . Diabetes Mother   . Hyperlipidemia Mother   . Anemia Daughter        blood transfusion at 10mos  . Vision loss Daughter        eye surgery  . Other Daughter        Sotos syndrome  . Hypertension Father   . Diabetes Father    Social History   Tobacco Use  . Smoking status: Never Smoker  . Smokeless tobacco: Never Used  Substance Use Topics  . Alcohol use: No  . Drug use: No  No Known Allergies    Review of Systems  Constitutional: Positive for fatigue. Negative for chills, diaphoresis and fever.  HENT: Negative for congestion, sore throat and trouble swallowing.   Respiratory: Negative for cough, chest tightness and shortness of breath.   Cardiovascular: Negative for chest pain, palpitations and leg swelling.  Gastrointestinal: Negative for abdominal pain, constipation, diarrhea,  nausea and vomiting.  Endocrine: Negative for polydipsia, polyphagia and polyuria.  Genitourinary: Negative for dysuria, flank pain and frequency.  Musculoskeletal: Negative for arthralgias, back pain, gait problem, joint swelling and myalgias.  Skin: Positive for color change and rash. Negative for wound.  Neurological: Positive for dizziness. Negative for headaches.       Objective:   Physical Exam BP 95/65 (BP Location: Left Arm, Patient Position: Sitting, Cuff Size: Normal)   Pulse 69   Temp 97.7 F (36.5 C) (Oral)   Resp 18   Ht 5' (1.524 m)   Wt 150 lb (68 kg)   LMP 03/25/2018 Comment: tubial ligation  SpO2 98%   BMI 29.29 kg/m Vital signs and nurse's notes reviewed General-well-nourished, well-developed young  adult female in no acute distress ENT- TMs gray, normal intranasal exam, normal oropharynx Neck-supple, patient does have some mild fullness in the region of the thyroid, no palpable masses, no cervical lymphadenopathy Lungs-clear to auscultation bilaterally Cardiovascular-regular rate and rhythm Abdomen-soft, nontender with the exception of mild discomfort with palpation of the suprapubic area, no rebound or guarding Back-no CVA tenderness Extremities-no edema Skin- patient with some flesh-colored, slightly raised solid papules of dry skin under the left border of the chin, on the right upper outer arm and on the right lower leg.  Patient also has moist skin beneath the left lateral margin of the breast and mild erythema which appears consistent with fungal infection         Assessment & Plan:  1. Dizziness Patient with hypotension, blood pressure of 95/65 at today's visit and blood pressure was repeated twice.  Patient is encouraged to remain well-hydrated as she is also breast-feeding and dehydration could play a role in her dizziness.  Patient will also have CBC to look for anemia and BMP to check her electrolytes - Basic Metabolic Panel - CBC with Differential  2. Elevated blood sugar Patient reports that she had elevated blood sugar during her pregnancy.  Patient will have hemoglobin A1c at today's visit - HgB A1c  3. Thyromegaly Patient with complaint of sensation of fullness in her anterior throat as well as sensation of weighted object on her neck that causes some shortness of breath when she is lying down.  Patient will have thyroid ultrasound scheduled and patient will have T4 and TSH at today's visit as patient also with complaint of fatigue. - US THYROID; Future - T4 AND TSH  4. Tinea corporis Discussed with the patient through the interpreter that she appears to have 2 different types of rashes.  Patient appears to have a fungal rash beneath the left breast and  prescription provided for nystatin cream. - nystatin cream (MYCOSTATIN); Apply 1 application topically 3 (three) times daily. X 10 days and then use as needed for itchy rash  Dispense: 30 g; Refill: 2  5. Rash and nonspecific skin eruption Patient with a rash beneath the chin, on the arm and the leg which appears to be a nonspecific dermatitis versus atopic dermatitis/eczema.  Prescription provided for hydrocortisone cream and patient should keep her skin well moisturized.  Patient should call or return if rash continues to spread or does not improve. - hydrocortisone cream 0.5 %; Apply 1 application topically 2 (two) times daily. As needed for dry skin rash  Dispense: 30 g; Refill: 6  6. Suprapubic discomfort Patient with suprapubic discomfort on examination and patient was asked to give a urine sample for urinalysis to see if she might possibly have a urinary  tract infection - POCT URINALYSIS DIP (CLINITEK)  7. Absent menses Patient with complaint of absence of menses since giving birth 6 months ago but patient also had tubal ligation therefore possibility of pregnancy is low however urine pregnancy test will be done at today's visit.  Patient's menses are likely being suppressed due to her breast-feeding but TSH will also be checked at today's visit. - POCT urine pregnancy  *Influenza immunization was offered at today's visit but declined by the patient  An After Visit Summary was printed and given to the patient.  Return in about 4 months (around 12/20/2018), or if symptoms worsen or fail to improve, for sooner if not feeling better otherwise 4 months.

## 2018-08-20 LAB — CBC WITH DIFFERENTIAL/PLATELET
Basophils Absolute: 0 x10E3/uL (ref 0.0–0.2)
Basos: 0 %
EOS (ABSOLUTE): 0.1 x10E3/uL (ref 0.0–0.4)
Eos: 2 %
Hematocrit: 39.2 % (ref 34.0–46.6)
Hemoglobin: 13 g/dL (ref 11.1–15.9)
Immature Grans (Abs): 0 x10E3/uL (ref 0.0–0.1)
Immature Granulocytes: 0 %
Lymphocytes Absolute: 1.8 x10E3/uL (ref 0.7–3.1)
Lymphs: 32 %
MCH: 29.4 pg (ref 26.6–33.0)
MCHC: 33.2 g/dL (ref 31.5–35.7)
MCV: 89 fL (ref 79–97)
Monocytes Absolute: 0.4 x10E3/uL (ref 0.1–0.9)
Monocytes: 8 %
Neutrophils Absolute: 3.2 x10E3/uL (ref 1.4–7.0)
Neutrophils: 58 %
Platelets: 199 x10E3/uL (ref 150–450)
RBC: 4.42 x10E6/uL (ref 3.77–5.28)
RDW: 11.5 % — ABNORMAL LOW (ref 12.3–15.4)
WBC: 5.5 x10E3/uL (ref 3.4–10.8)

## 2018-08-20 LAB — BASIC METABOLIC PANEL WITH GFR
BUN/Creatinine Ratio: 18 (ref 9–23)
BUN: 12 mg/dL (ref 6–20)
CO2: 21 mmol/L (ref 20–29)
Calcium: 9.4 mg/dL (ref 8.7–10.2)
Chloride: 104 mmol/L (ref 96–106)
Creatinine, Ser: 0.66 mg/dL (ref 0.57–1.00)
GFR calc Af Amer: 141 mL/min/1.73
GFR calc non Af Amer: 122 mL/min/1.73
Glucose: 90 mg/dL (ref 65–99)
Potassium: 4.3 mmol/L (ref 3.5–5.2)
Sodium: 142 mmol/L (ref 134–144)

## 2018-08-20 LAB — T4 AND TSH
T4, Total: 6.4 ug/dL (ref 4.5–12.0)
TSH: 2.31 u[IU]/mL (ref 0.450–4.500)

## 2018-08-21 ENCOUNTER — Telehealth: Payer: Self-pay | Admitting: *Deleted

## 2018-08-21 NOTE — Telephone Encounter (Signed)
Medical Assistant used [employer] to contact patient.  Interpreter Name: Thurston Hole #: [id number] Patient is aware of thyroid, blood count, and electrolytes being normal. No further questions.

## 2018-08-21 NOTE — Telephone Encounter (Signed)
-----   Message from Cain Saupe, MD sent at 08/21/2018  2:16 PM EDT ----- Please notify patient of normal thyroid blood tests, normal BMP and normal CBC

## 2018-12-21 ENCOUNTER — Ambulatory Visit: Payer: Medicaid Other | Attending: Family Medicine | Admitting: Family Medicine

## 2018-12-21 ENCOUNTER — Encounter: Payer: Self-pay | Admitting: Family Medicine

## 2018-12-21 VITALS — BP 127/75 | HR 83 | Temp 98.2°F | Resp 18 | Ht 60.0 in | Wt 150.0 lb

## 2018-12-21 DIAGNOSIS — X58XXXA Exposure to other specified factors, initial encounter: Secondary | ICD-10-CM | POA: Insufficient documentation

## 2018-12-21 DIAGNOSIS — S20219A Contusion of unspecified front wall of thorax, initial encounter: Secondary | ICD-10-CM | POA: Diagnosis not present

## 2018-12-21 DIAGNOSIS — Z23 Encounter for immunization: Secondary | ICD-10-CM | POA: Diagnosis not present

## 2018-12-21 DIAGNOSIS — R21 Rash and other nonspecific skin eruption: Secondary | ICD-10-CM | POA: Diagnosis not present

## 2018-12-21 DIAGNOSIS — L853 Xerosis cutis: Secondary | ICD-10-CM | POA: Insufficient documentation

## 2018-12-21 DIAGNOSIS — T148XXA Other injury of unspecified body region, initial encounter: Secondary | ICD-10-CM

## 2018-12-21 DIAGNOSIS — R1012 Left upper quadrant pain: Secondary | ICD-10-CM | POA: Diagnosis not present

## 2018-12-21 DIAGNOSIS — K5900 Constipation, unspecified: Secondary | ICD-10-CM | POA: Diagnosis not present

## 2018-12-21 MED ORDER — IBUPROFEN 600 MG PO TABS: 600.0000 mg | ORAL_TABLET | Freq: Three times a day (TID) | ORAL | 0 refills | Status: DC | PRN

## 2018-12-21 MED ORDER — POLYETHYLENE GLYCOL 3350 17 GM/SCOOP PO POWD: 1.0000 | Freq: Every day | ORAL | 6 refills | Status: DC

## 2018-12-21 MED ORDER — HYDROCORTISONE 0.5 % EX CREA: 1.0000 "application " | TOPICAL_CREAM | Freq: Two times a day (BID) | CUTANEOUS | 6 refills | Status: DC

## 2018-12-21 NOTE — Patient Instructions (Signed)
Estreñimiento en los adultos  Constipation, Adult  Se llama estreñimiento cuando:  · Tiene deposiciones (defeca) una menor cantidad de veces a la semana de lo normal.  · Tiene dificultad para defecar.  · Las heces son secas y duras o son más grandes que lo normal.  Siga estas indicaciones en su casa:  Comida y bebida    · Consuma alimentos con alto contenido de fibra, por ejemplo:  ? Frutas y verduras frescas.  ? Cereales integrales.  ? Frijoles.  · Consuma una menor cantidad de alimentos ricos en grasas, con bajo contenido de fibra o excesivamente procesados, como:  ? Papas fritas.  ? Hamburguesas.  ? Galletas.  ? Caramelos.  ? Gaseosas.  · Beba suficiente líquido para mantener el pis (orina) claro o de color amarillo pálido.  Instrucciones generales  · Haga actividad física con regularidad o según las indicaciones del médico.  · Vaya al baño cuando sienta la necesidad de defecar. No se aguante las ganas.  · Tome los medicamentos de venta libre y los recetados solamente como se lo haya indicado el médico. Estos incluyen los suplementos de fibra.  · Realice ejercicios de reentrenamiento del suelo pélvico, como:  ? Respirar profundamente mientras relaja la parte inferior del vientre (abdomen).  ? Relajar el suelo pélvico mientras defeca.  · Controle su afección para ver si hay cambios.  · Concurra a todas las visitas de control como se lo haya indicado el médico. Esto es importante.  Comuníquese con un médico si:  · Siente un dolor que empeora.  · Tiene fiebre.  · No ha defecado por 4 días.  · Vomita.  · No tiene hambre.  · Pierde peso.  · Tiene una hemorragia en el ano.  · Las deposiciones (heces) son delgadas como un lápiz.  Solicite ayuda de inmediato si:  · Tiene fiebre, y los síntomas empeoran de repente.  · Tiene pérdida de materia fecal u observa sangre en las heces.  · Siente el vientre más duro o más grande de lo normal (está hinchado).  · Siente un dolor muy intenso en el vientre.  · Se siente mareado o se  desmaya.  Esta información no tiene como fin reemplazar el consejo del médico. Asegúrese de hacerle al médico cualquier pregunta que tenga.  Document Released: 11/30/2010 Document Revised: 01/29/2017 Document Reviewed: 04/17/2016  Elsevier Interactive Patient Education © 2019 Elsevier Inc.

## 2018-12-21 NOTE — Progress Notes (Signed)
Subjective:    Patient ID: Rebecca Nash, female    DOB: Sep 16, 1991, 28 y.o.   MRN: [id number]   Due to a language barrier [employer] interpretation system was used at today's visit  HPI       28 yo female with the complaint of noticing two abnormal  discolored skin areas on her chest. Areas are a light yellow and she does not recall anything that could have caused her to have a color change or bruising to her chest wall.The areas are not itchy, painful or raised.  Patient is currently breastfeeding her 67 month old. Patient does get bruising at times on her arms or legs but this is generally from bumping into something.  Patient reports that she still occasionally gets areas of dry skin and she would like a refill of hydrocortisone cream that was prescribed in the past.      On review of systems, patient denies any unusual bleeding such as nosebleeds, blood in the stool, blood in the urine or bleeding from her gums when brushing her teeth.  Patient has had no recent fever or chills.  Patient does have some fatigue.  Patient denies any chest pain or palpitations, no shortness of breath or cough.  Patient reports that she does get occasional abdominal pain in her left upper abdomen which comes and goes.  Patient also continues to have issues with constipation.  Patient denies any black stools and no blood in the stool.        Past Medical History:  Diagnosis Date  . Depression    was on meds prior to moving here post partum  . Late prenatal care    Past Surgical History:  Procedure Laterality Date  . TUBAL LIGATION Bilateral 01/28/2018   Procedure: POST PARTUM TUBAL LIGATION;  Surgeon: Kathrynn Running, MD;  Location: [facility]TH[facility] SUIT[facility];  Service: Gynecology;  Laterality: Bilateral;   Social History   Tobacco Use  . Smoking status: Never Smoker  . Smokeless tobacco: Never Used  Substance Use Topics  . Alcohol use: No  . Drug use: No   No Known Allergies    Review of Systems    Constitutional: Positive for fatigue (mild). Negative for chills and fever.  HENT: Negative for nosebleeds, sore throat and trouble swallowing.   Respiratory: Negative for cough and shortness of breath.   Cardiovascular: Negative for chest pain and leg swelling.  Gastrointestinal: Positive for abdominal pain and constipation. Negative for blood in stool, diarrhea and nausea.  Endocrine: Negative for cold intolerance, heat intolerance, polydipsia, polyphagia and polyuria.  Genitourinary: Negative for dysuria, flank pain and frequency.  Musculoskeletal: Negative for arthralgias, back pain, gait problem, joint swelling and myalgias.  Skin: Positive for color change and rash. Negative for wound.  Neurological: Negative for dizziness and headaches.  Hematological: Negative for adenopathy. Does not bruise/bleed easily.       Objective:   Physical Exam BP 127/75 (BP Location: Left Arm, Patient Position: Sitting, Cuff Size: Normal)   Pulse 83   Temp 98.2 F (36.8 C) (Oral)   Resp 18   Ht 5' (1.524 m)   Wt 150 lb (68 kg)   LMP 11/21/2018   SpO2 96%   BMI 29.29 kg/m  Nurses notes and vital signs reviewed General-well-nourished, well-developed female in no acute distress ENT- TMs gray, normal appearance to the nasal passages, normal oral cavity/posterior pharynx Neck-supple, no lymphadenopathy, no thyromegaly Lungs- clear to auscultation bilaterally Cardiovascular-regular rate and rhythm Abdomen-soft, nontender, patient  with some mild left upper quadrant discomfort to palpation, no rebound or guarding, no splenomegaly Back-no CVA tenderness Extremities-no edema Skin- patient with 2 dime sized areas on either side of the chest laterally with yellowish discoloration to the skin, areas are nontender and not raised and no pain with palpation skin areas appear to be consistent with resolving bruises      Assessment & Plan:  1. Intermittent left upper quadrant abdominal pain Patient with  complaint of occasional left upper quadrant abdominal discomfort.  Patient does have some mild discomfort on today's exam.  Patient also has history of recurrent constipation.  Patient will have CBC to look for any inflammation or blood loss.  Patient will be prescribed medication to help with constipation but patient was asked to follow-up if her abdominal pain does not resolve over the next 4-6 weeks.  Return to clinic sooner if any acute worsening of abdominal pain or seek medical attention at the local ED. - CBC with Differential  2. Constipation, unspecified constipation type Patient reports recurrent issues with constipation.  Patient is encouraged to increase her water intake.  Patient with normal TSH and T4 in October of last year.  Patient is being prescribed MiraLAX which she will take once daily and patient is encouraged to mix the MiraLAX with apple juice or apple pear juice which she can find at her local grocery store or in the babyfood section (apple/pear juice).  As patient is still breast-feeding, patient was asked to take the MiraLAX at bedtime after the last feeding for the day.  Patient should return to clinic in the next 4 to 6 weeks in follow-up of constipation and abdominal pain. - polyethylene glycol powder (GLYCOLAX/MIRALAX) powder; Take 255 g by mouth daily. As needed for constipation  Dispense: 255 g; Refill: 6  3. Bruising Patient with evidence of bruising and patient will have CBC in follow-up.  Discussed with the patient that more than likely her child had bumped against her chest when she was carrying her child or breast-feeding. - CBC with Differential  4. Rash and nonspecific skin eruption Refill of hydrocortisone cream for areas of dry skin dermatitis. - hydrocortisone cream 0.5 %; Apply 1 application topically 2 (two) times daily. As needed for dry skin rash  Dispense: 30 g; Refill: 6  5. Need for immunization against influenza Patient was offered and agreed to have  influenza immunization at today's visit.  Patient given educational handout regarding influenza immunization.  An After Visit Summary was printed and given to the patient.  Return in about 4 weeks (around 01/18/2019) for f/u abdominal pain.

## 2018-12-22 LAB — CBC WITH DIFFERENTIAL/PLATELET
Basophils Absolute: 0 x10E3/uL (ref 0.0–0.2)
Basos: 0 %
EOS (ABSOLUTE): 0.2 x10E3/uL (ref 0.0–0.4)
Eos: 2 %
Hematocrit: 41.1 % (ref 34.0–46.6)
Hemoglobin: 13.7 g/dL (ref 11.1–15.9)
Immature Grans (Abs): 0 x10E3/uL (ref 0.0–0.1)
Immature Granulocytes: 0 %
Lymphocytes Absolute: 1.9 x10E3/uL (ref 0.7–3.1)
Lymphs: 27 %
MCH: 30 pg (ref 26.6–33.0)
MCHC: 33.3 g/dL (ref 31.5–35.7)
MCV: 90 fL (ref 79–97)
Monocytes Absolute: 0.6 x10E3/uL (ref 0.1–0.9)
Monocytes: 9 %
Neutrophils Absolute: 4.3 x10E3/uL (ref 1.4–7.0)
Neutrophils: 62 %
Platelets: 194 x10E3/uL (ref 150–450)
RBC: 4.57 x10E6/uL (ref 3.77–5.28)
RDW: 11.9 % (ref 11.7–15.4)
WBC: 7 x10E3/uL (ref 3.4–10.8)

## 2018-12-25 ENCOUNTER — Telehealth: Payer: Self-pay | Admitting: *Deleted

## 2018-12-25 NOTE — Telephone Encounter (Signed)
Medical Assistant used [employer] to contact patient.  Interpreter Name: Marge Duncans Interpreter #: [phone] Please inform patient of blood count being normal

## 2018-12-25 NOTE — Telephone Encounter (Signed)
-----   Message from Cain Saupe, MD sent at 12/24/2018 10:27 PM EST ----- Notify patient of normal CBC

## 2019-03-09 ENCOUNTER — Telehealth: Payer: Self-pay | Admitting: Family Medicine

## 2019-03-09 NOTE — Telephone Encounter (Signed)
Patient states that she found a lump underneath her skin and is unsure what it could be. Patient states it is under her breast near her side area. Please follow up   Patient was schedule for a OV and patient would like for it to be in person. Please follow up.

## 2019-03-10 NOTE — Telephone Encounter (Signed)
Tried calling patient and was not able to reach her. Phone only rang with no voicemail. Staff called [phone].

## 2019-03-10 NOTE — Telephone Encounter (Signed)
Please schedule patient with myself or first available provider appointment in follow-up of her breast nodule

## 2019-03-11 NOTE — Telephone Encounter (Signed)
Patient is scheduled for May 1st with provider.

## 2019-03-12 ENCOUNTER — Ambulatory Visit: Payer: Medicaid Other | Attending: Family Medicine | Admitting: Family Medicine

## 2019-03-12 ENCOUNTER — Encounter: Payer: Self-pay | Admitting: Family Medicine

## 2019-03-12 ENCOUNTER — Other Ambulatory Visit: Payer: Self-pay

## 2019-03-12 VITALS — BP 99/67 | HR 76 | Temp 98.4°F | Ht 60.0 in | Wt 141.4 lb

## 2019-03-12 DIAGNOSIS — R222 Localized swelling, mass and lump, trunk: Secondary | ICD-10-CM | POA: Diagnosis not present

## 2019-03-12 NOTE — Patient Instructions (Signed)
Lipoma  Lipoma    Un lipoma es un tumor no canceroso (benigno) formado por clulas de grasa. Es un tipo muy frecuente de crecimiento en los tejidos blandos. Por lo general, los lipomas se encuentran debajo de la piel (subcutneos). Pueden aparecer en cualquier tejido del cuerpo que contenga grasa. Las reas en las que los lipomas aparecen con mayor frecuencia incluyen la espalda, los hombros, las nalgas y los muslos.   Los lipomas crecen lentamente y, en general, son indoloros. La mayora de los lipomas no causan problemas y no requieren tratamiento.  Cules son las causas?  Se desconoce la causa de esta afeccin.  Qu incrementa el riesgo?  Es ms probable que usted sufra esta afeccin si:   Tiene entre 40 y 60aos de edad.   Tiene antecedentes familiares de lipomas.  Cules son los signos o los sntomas?  Por lo general, el lipoma aparece como una pequea protuberancia redonda debajo de la piel. En la mayora de los casos, el bulto tiene las siguientes caractersticas:   Se siente suave o elstico.   No causan dolor ni otros sntomas.  Sin embargo, si el lipoma se encuentra en un rea en la que hace presin sobre los nervios, puede causar dolor u otros sntomas.  Cmo se diagnostica?  Por lo general, el lipoma puede diagnosticarse con un examen fsico. Tambin pueden hacerle estudios para confirmar el diagnstico y descartar otras afeccin. Los estudios pueden incluir lo siguiente:   Pruebas de diagnstico por imgenes, como una resonancia magntica (RM) o una exploracin por tomografa computarizada (TC).   Extraccin de una muestra de tejido para analizar con un microscopio (biopsia).  Cmo se trata?  El tratamiento de esta afeccin depende del tamao del lipoma y si causa algn sntoma.   Los lipomas pequeos que no causan problemas no requieren tratamiento.   Si un lipoma se agranda o causa problemas, puede realizarse una ciruga para extirpar el lipoma. Los lipomas tambin pueden extirparse para  mejorar el aspecto. En la mayora de los casos, el procedimiento se realiza despus de aplicar un medicamento que adormece el rea (anestesia local).  Siga estas indicaciones en su casa:   Controle el lipoma para detectar cambios.   Concurra a todas las visitas de control como se lo haya indicado el mdico. Esto es importante.  Comunquese con un mdico si:   El lipoma se agranda o se endurece.   El lipoma comienza a causarle dolor, se enrojece o se hincha cada vez ms. Estos podran ser signos de infeccin o de una afeccin ms grave.  Solicite ayuda de inmediato si:   Siente hormigueo o adormecimiento en el rea cerca del lipoma. Esto podra indicar que el lipoma es lo que causa dao nervioso.  Resumen   Un lipoma es un tumor no canceroso formado por clulas de grasa.   La mayora de los lipomas no causan problemas y no requieren tratamiento.   Si un lipoma se agranda o causa problemas, puede realizarse una ciruga para extirpar el lipoma.  Esta informacin no tiene como fin reemplazar el consejo del mdico. Asegrese de hacerle al mdico cualquier pregunta que tenga.  Document Released: 08/07/2005 Document Revised: 12/22/2017 Document Reviewed: 12/22/2017  Elsevier Interactive Patient Education  2019 Elsevier Inc[employer]

## 2019-03-12 NOTE — Progress Notes (Signed)
Established Patient Office Visit  Subjective:  Patient ID: Rebecca Nash, female    DOB: May 06, 1991  Age: 28 y.o. MRN: [id number]  CC:  Chief Complaint  Patient presents with  . Cyst    HPI Rebecca Nash presents due to the complaint of a nodule on her left upper abdomen which she first felt about 5 days ago.  Patient states that she had had some mild discomfort off and on in this area for about a year but then 5 days ago she felt a small nodule and wanted to come in and have this area checked.  Patient states that she would not really call the area painful but she has discomfort when the area is palpated but otherwise no discomfort.  She denies any fever or chills, no night sweats.  No unexplained weight loss.  No abdominal pain, no nausea/vomiting, no diarrhea or constipation, no blood in the stool or dark stools. No other skin changes and no headaches or dizziness.   Past Medical History:  Diagnosis Date  . Depression    was on meds prior to moving here post partum  . Late prenatal care     Past Surgical History:  Procedure Laterality Date  . TUBAL LIGATION Bilateral 01/28/2018   Procedure: POST PARTUM TUBAL LIGATION;  Surgeon: Kathrynn Running, MD;  Location: [facility] BIRTHING SU[facility];  Service: Gynecology;  Laterality: Bilateral;    Family History  Problem Relation Age of Onset  . Other Other        Moebius syndrome  . Hypertension Mother   . Colon cancer Mother   . Cervical cancer Mother   . Diabetes Mother   . Hyperlipidemia Mother   . Anemia Daughter        blood transfusion at 10mos  . Vision loss Daughter        eye surgery  . Other Daughter        Sotos syndrome  . Hypertension Father   . Diabetes Father    Social History   Tobacco Use  . Smoking status: Never Smoker  . Smokeless tobacco: Never Used  Substance Use Topics  . Alcohol use: No  . Drug use: No     Outpatient Medications Prior to Visit  Medication Sig Dispense Refill  . hydrocortisone  cream 0.5 % Apply 1 application topically 2 (two) times daily. As needed for dry skin rash 30 g 6  . ibuprofen (ADVIL,MOTRIN) 600 MG tablet Take 1 tablet (600 mg total) by mouth every 8 (eight) hours as needed for moderate pain. 30 tablet 0  . Prenatal Vit w/Fe-Methylfol-FA (PNV PO) Take by mouth.    . nystatin cream (MYCOSTATIN) Apply 1 application topically 3 (three) times daily. X 10 days and then use as needed for itchy rash (Patient not taking: Reported on 12/21/2018) 30 g 2  . polyethylene glycol powder (GLYCOLAX/MIRALAX) powder Take 255 g by mouth daily. As needed for constipation (Patient not taking: Reported on 03/12/2019) 255 g 6   No facility-administered medications prior to visit.     No Known Allergies  ROS Review of Systems  Constitutional: Positive for fatigue (occasional). Negative for chills, fever and unexpected weight change.  HENT: Negative for congestion, sore throat and trouble swallowing.   Respiratory: Negative for cough and shortness of breath.   Cardiovascular: Negative for chest pain and palpitations.  Gastrointestinal: Negative for abdominal pain, constipation, diarrhea and nausea.  Endocrine: Negative for cold intolerance, heat intolerance, polydipsia, polyphagia and polyuria.  Genitourinary: Negative  for dysuria and frequency.  Musculoskeletal: Negative for arthralgias, back pain, gait problem, joint swelling and myalgias.  Skin:       Complaint of a nodule on the left upper abdomen that causes discomfort when the area is touched  Neurological: Negative for dizziness and headaches.  Hematological: Negative for adenopathy. Does not bruise/bleed easily.      Objective:    Physical Exam  Constitutional: She is oriented to person, place, and time. She appears well-developed and well-nourished. No distress.  Neck: Normal range of motion. Neck supple.  Cardiovascular: Normal rate and regular rhythm.  Pulmonary/Chest: Effort normal and breath sounds normal.   Abdominal: Soft. There is abdominal tenderness (Patient with complaint of mild discomfort with palpation of the area of the subcutaneous nodule in the left upper abdomen). There is no rebound and no guarding.  No CVA tenderness  Musculoskeletal:        General: No tenderness or edema.  Lymphadenopathy:    She has no cervical adenopathy.  Neurological: She is alert and oriented to person, place, and time.  Skin: Skin is warm and dry.     Psychiatric: Her behavior is normal. Thought content normal.  Patient seems slightly anxious  Nursing note and vitals reviewed.   BP 99/67 (BP Location: Right Arm, Patient Position: Sitting, Cuff Size: Large)   Pulse 76   Temp 98.4 F (36.9 C) (Oral)   Ht 5' (1.524 m)   Wt 141 lb 6.4 oz (64.1 kg)   LMP 03/11/2019   SpO2 99%   Breastfeeding No   BMI 27.62 kg/m  Wt Readings from Last 3 Encounters:  03/12/19 141 lb 6.4 oz (64.1 kg)  12/21/18 150 lb (68 kg)  08/19/18 150 lb (68 kg)     Health Maintenance Due  Topic Date Due  . TETANUS/TDAP  01/17/2011  . PAP-Cervical Cytology Screening  01/16/2013  . PAP SMEAR-Modifier  01/16/2013      Lab Results  Component Value Date   TSH 2.310 08/19/2018   Lab Results  Component Value Date   WBC 7.0 12/21/2018   HGB 13.7 12/21/2018   HCT 41.1 12/21/2018   MCV 90 12/21/2018   PLT 194 12/21/2018   Lab Results  Component Value Date   NA 142 08/19/2018   K 4.3 08/19/2018   CO2 21 08/19/2018   GLUCOSE 90 08/19/2018   BUN 12 08/19/2018   CREATININE 0.66 08/19/2018   CALCIUM 9.4 08/19/2018   No results found for: CHOL No results found for: HDL No results found for: LDLCALC No results found for: TRIG No results found for: [facility] Lab Results  Component Value Date   HGBA1C 5.2 08/19/2018      Assessment & Plan:   1. Subcutaneous nodule of abdominal wall Patient with a subcutaneous, soft, compressible nodule of the left upper abdominal wall which most likely represents a lipoma but  a lymph node could also be in the differential.  Discussed with patient the option of observation of the area to see if it becomes any larger or more tender versus referral to have the nodule removed.  Patient states that because of her family history of her mother having cervical and colon cancer that she would like to have the nodule removed to make sure that this is not cancerous.  Patient will be referred to general surgery for further evaluation and treatment.  Information on lipoma was provided as part of her after visit summary.    Follow-up: Return if symptoms worsen  or fail to improve.   Cain Saupeammie Kayzlee Wirtanen, MD

## 2019-03-12 NOTE — Progress Notes (Signed)
Per pt she has a knot like feeling on her upper left abd closer to her left breast. Per pt there's only pain if you push on it but it's noticeable.

## 2019-08-24 ENCOUNTER — Other Ambulatory Visit: Payer: Self-pay

## 2019-08-24 DIAGNOSIS — Z20822 Contact with and (suspected) exposure to covid-19: Secondary | ICD-10-CM

## 2019-08-26 LAB — NOVEL CORONAVIRUS, NAA: SARS-CoV-2, NAA: NOT DETECTED

## 2019-10-21 ENCOUNTER — Other Ambulatory Visit: Payer: Self-pay

## 2019-10-21 DIAGNOSIS — Z20822 Contact with and (suspected) exposure to covid-19: Secondary | ICD-10-CM

## 2019-10-23 LAB — NOVEL CORONAVIRUS, NAA: SARS-CoV-2, NAA: NOT DETECTED

## 2019-12-10 ENCOUNTER — Other Ambulatory Visit: Payer: Self-pay

## 2019-12-10 ENCOUNTER — Ambulatory Visit (HOSPITAL_COMMUNITY)
Admission: EM | Admit: 2019-12-10 | Discharge: 2019-12-10 | Disposition: A | Discharge status: Home / Self Care | Payer: Medicaid Other | Attending: Family Medicine | Admitting: Family Medicine

## 2019-12-10 ENCOUNTER — Encounter (HOSPITAL_COMMUNITY): Payer: Self-pay

## 2019-12-10 DIAGNOSIS — N61 Mastitis without abscess: Secondary | ICD-10-CM | POA: Diagnosis not present

## 2019-12-10 MED ORDER — SULFAMETHOXAZOLE-TRIMETHOPRIM 800-160 MG PO TABS: 1.0000 | ORAL_TABLET | Freq: Two times a day (BID) | ORAL | 0 refills | Status: AC

## 2019-12-10 MED ORDER — NYSTATIN 100000 UNIT/GM EX CREA: TOPICAL_CREAM | CUTANEOUS | 1 refills | Status: DC

## 2019-12-10 NOTE — ED Provider Notes (Signed)
[facility]    CSN: [id number] Arrival date & time: 12/10/19  1434      History   Chief Complaint Chief Complaint  Patient presents with  . Breast Pain    HPI Rebecca Nash is a 29 y.o. female.   HPI  Rebecca Nash presents with bilateral breast pain and nipple pain. She is breastfeeding, however, is only limiting feedings to nighttime. Her breast are fully engorged. She has not nursed within the last 24 hours due to severe pain. Breast (bilateral) are warm and erythematous. No fever, nausea, or vomiting. Reports nipple itching and discharge prior to breast tenderness occurring. She has not attempted relief with any OTC or conservative measures.  Past Medical History:  Diagnosis Date  . Depression    was on meds prior to moving here post partum  . Late prenatal care     Patient Active Problem List   Diagnosis Date Noted  . History of shoulder dystocia in prior pregnancy 01/30/2018  . Thrombocytopenia (HCC) 01/28/2018  . Family history of congenital anomalies 08/10/2013  . Family history of genetic disorder 08/10/2013    Past Surgical History:  Procedure Laterality Date  . TUBAL LIGATION Bilateral 01/28/2018   Procedure: POST PARTUM TUBAL LIGATION;  Surgeon: Kathrynn Running, MD;  Location: [facility];  Service: Gynecology;  Laterality: Bilateral;    OB History    Gravida  3   Para  3   Term  3   Preterm  0   AB  0   Living  3     SAB  0   TAB  0   Ectopic  0   Multiple  0   Live Births  3            Home Medications    Prior to Admission medications   Medication Sig Start Date End Date Taking? Authorizing Provider  hydrocortisone cream 0.5 % Apply 1 application topically 2 (two) times daily. As needed for dry skin rash 12/21/18   Fulp, Cammie, MD  ibuprofen (ADVIL,MOTRIN) 600 MG tablet Take 1 tablet (600 mg total) by mouth every 8 (eight) hours as needed for moderate pain. 12/21/18   Fulp, Cammie, MD  nystatin cream  (MYCOSTATIN) Apply 1 application topically 3 (three) times daily. X 10 days and then use as needed for itchy rash Patient not taking: Reported on 12/21/2018 08/19/18   Fulp, Cammie, MD  polyethylene glycol powder (GLYCOLAX/MIRALAX) powder Take 255 g by mouth daily. As needed for constipation Patient not taking: Reported on 03/12/2019 12/21/18   Cain Saupe, MD  Prenatal Vit w/Fe-Methylfol-FA (PNV PO) Take by mouth.    [provider]    Family History Family History  Problem Relation Age of Onset  . Other Other        Moebius syndrome  . Hypertension Mother   . Colon cancer Mother   . Cervical cancer Mother   . Diabetes Mother   . Hyperlipidemia Mother   . Anemia Daughter        blood transfusion at 33mos  . Vision loss Daughter        eye surgery  . Other Daughter        Sotos syndrome  . Hypertension Father   . Diabetes Father     Social History Social History   Tobacco Use  . Smoking status: Never Smoker  . Smokeless tobacco: Never Used  Substance Use Topics  . Alcohol use: No  . Drug use: No  Allergies   Patient has no known allergies.   Review of Systems Review of Systems Pertinent negatives listed in HPI Physical Exam Triage Vital Signs ED Triage Vitals [12/10/19 1452]  Enc Vitals Group     BP      Pulse      Resp      Temp      Temp src      SpO2      Weight      Height      Head Circumference      Peak Flow      Pain Score 8     Pain Loc      Pain Edu?      Excl. in GC?    No data found.  Updated Vital Signs LMP 11/26/2019 (Approximate)   Visual Acuity Right Eye Distance:   Left Eye Distance:   Bilateral Distance:    Right Eye Near:   Left Eye Near:    Bilateral Near:     Physical Exam Constitutional:      Appearance: Normal appearance.  HENT:     Head: Normocephalic.  Cardiovascular:     Rate and Rhythm: Normal rate and regular rhythm.  Pulmonary:     Effort: Pulmonary effort is normal.     Breath sounds: Normal  breath sounds.  Chest:     Breasts:        Right: Swelling, skin change and tenderness present.        Left: Swelling, nipple discharge, skin change and tenderness present.  Musculoskeletal:     Cervical back: Normal range of motion.  Lymphadenopathy:     Upper Body:     Right upper body: No axillary adenopathy.     Left upper body: No axillary adenopathy.  Skin:    General: Skin is warm.     Findings: Erythema present.  Neurological:     Mental Status: She is alert and oriented to person, place, and time.  Psychiatric:        Mood and Affect: Mood normal.        Behavior: Behavior normal.      UC Treatments / Results  Labs (all labs ordered are listed, but only abnormal results are displayed) Labs Reviewed - No data to display  EKG   Radiology No results found.  Procedures Procedures (including critical care time)  Medications Ordered in UC Medications - No data to display  Initial Impression / Assessment and Plan / UC Course  I have reviewed the triage vital signs and the nursing notes.  Pertinent labs & imaging results that were available during my care of the patient were reviewed by me and considered in my medical decision making (see chart for details).     Acute Mastitis, treat empirically with Bactrim BID X 7 days. For left nipple itching and irritation, trial Nystatin cream PRN. Pump breast to empty and relieve milk burden due to severe breast engorgement.  Resume nursing in 24 hours and pump when breast are full and when unable to breastfeed.  Also use warm compresses to reduce pain . Patient verbalized understanding and agreement with plan Final Clinical Impressions(s) / UC Diagnoses   Final diagnoses:  Acute mastitis     Discharge Instructions     Nystatin topical ointment. Bactrim twice daily x 7 days. Pump breast until breast are empty over the course of 24 hours. You may resume breast feeding tomorrow. Do not allow breast to become overly full  as these symptoms can reoccur.    ED Prescriptions    Medication Sig Dispense Auth. Provider   sulfamethoxazole-trimethoprim (BACTRIM DS) 800-160 MG tablet Take 1 tablet by mouth 2 (two) times daily for 7 days. 14 tablet Scot Jun, FNP   nystatin cream (MYCOSTATIN) Apply to affected area 2 times daily PRN. Apply directly to breast nipple. 15 g Scot Jun, FNP     PDMP not reviewed this encounter.   Scot Jun, FNP 12/10/19 2218

## 2019-12-10 NOTE — Discharge Instructions (Addendum)
Nystatin topical ointment. Bactrim twice daily x 7 days. Pump breast until breast are empty over the course of 24 hours. You may resume breast feeding tomorrow. Do not allow breast to become overly full as these symptoms can reoccur.

## 2019-12-10 NOTE — ED Triage Notes (Signed)
Pt c/o bilat breast pain for approx 1 week. Currently nursing and states left hurts worse than right; both with "white" area, with redness and feels hot to the touch. Denies fever, chills or abnormal discharge.

## 2019-12-15 ENCOUNTER — Other Ambulatory Visit: Payer: Self-pay

## 2019-12-15 ENCOUNTER — Ambulatory Visit: Payer: Medicaid Other | Attending: Family Medicine | Admitting: Physician Assistant

## 2019-12-15 DIAGNOSIS — B379 Candidiasis, unspecified: Secondary | ICD-10-CM

## 2019-12-15 DIAGNOSIS — Z09 Encounter for follow-up examination after completed treatment for conditions other than malignant neoplasm: Secondary | ICD-10-CM

## 2019-12-15 MED ORDER — FLUCONAZOLE 150 MG PO TABS: 150.0000 mg | ORAL_TABLET | Freq: Once | ORAL | 0 refills | Status: AC

## 2019-12-15 NOTE — Progress Notes (Signed)
Patient ID: Rebecca Nash, female   DOB: [birth year], 29 y.o.   MRN: [id number]   Virtual Visit via Telephone Note  I connected with Claudius Sis on 12/15/19 at  3:50 PM EST by telephone and verified that I am speaking with the correct person using two identifiers.   I discussed the limitations, risks, security and privacy concerns of performing an evaluation and management service by telephone and the availability of in person appointments. I also discussed with the patient that there may be a patient responsible charge related to this service. The patient expressed understanding and agreed to proceed.  PATIENT visit by telephone virtually in the context of Covid-19 pandemic. Patient location:  home My Location:  [facility] office Persons on the call: me and the patient.     History of Present Illness: After ED visit 12/10/2019 for mastitis.  She was also having itching on the nipple.  She is feeling much better.  Still has a couple doses of antibiotics left.  She would like Rx for diflucan in case she gets a yeast infection.  No fever.    From A/P: Acute Mastitis, treat empirically with Bactrim BID X 7 days. For left nipple itching and irritation, trial Nystatin cream PRN. Pump breast to empty and relieve milk burden due to severe breast engorgement.   Observations/Objective:  NAD.  A&Ox3  Assessment and Plan: 1. Yeast infection If needed s/p antibiotics - fluconazole (DIFLUCAN) 150 MG tablet; Take 1 tablet (150 mg total) by mouth once for 1 dose.  Dispense: 1 tablet; Refill: 0  2. Encounter for examination following treatment at hospital improved   Follow Up Instructions: prn   I discussed the assessment and treatment plan with the patient. The patient was provided an opportunity to ask questions and all were answered. The patient agreed with the plan and demonstrated an understanding of the instructions.   The patient was advised to call back or seek an in-person evaluation if the  symptoms worsen or if the condition fails to improve as anticipated.  I provided 9 minutes of non-face-to-face time during this encounter.   Georgian Co, PA-C

## 2020-02-03 ENCOUNTER — Other Ambulatory Visit: Payer: Self-pay

## 2020-02-03 ENCOUNTER — Other Ambulatory Visit (HOSPITAL_COMMUNITY)
Admission: RE | Admit: 2020-02-03 | Discharge: 2020-02-03 | Disposition: A | Discharge status: Home / Self Care | Payer: Medicaid Other | Source: Ambulatory Visit | Attending: Family Medicine | Admitting: Family Medicine

## 2020-02-03 ENCOUNTER — Ambulatory Visit: Payer: Medicaid Other | Attending: Family Medicine | Admitting: Family Medicine

## 2020-02-03 ENCOUNTER — Encounter: Payer: Self-pay | Admitting: Physician Assistant

## 2020-02-03 ENCOUNTER — Encounter: Payer: Self-pay | Admitting: Family Medicine

## 2020-02-03 VITALS — BP 102/71 | HR 68 | Ht 60.0 in | Wt 147.4 lb

## 2020-02-03 DIAGNOSIS — Z01411 Encounter for gynecological examination (general) (routine) with abnormal findings: Secondary | ICD-10-CM | POA: Diagnosis not present

## 2020-02-03 DIAGNOSIS — Z124 Encounter for screening for malignant neoplasm of cervix: Secondary | ICD-10-CM | POA: Diagnosis present

## 2020-02-03 DIAGNOSIS — Z8249 Family history of ischemic heart disease and other diseases of the circulatory system: Secondary | ICD-10-CM | POA: Diagnosis not present

## 2020-02-03 DIAGNOSIS — L988 Other specified disorders of the skin and subcutaneous tissue: Secondary | ICD-10-CM

## 2020-02-03 DIAGNOSIS — N644 Mastodynia: Secondary | ICD-10-CM

## 2020-02-03 DIAGNOSIS — Z0000 Encounter for general adult medical examination without abnormal findings: Secondary | ICD-10-CM

## 2020-02-03 DIAGNOSIS — Z833 Family history of diabetes mellitus: Secondary | ICD-10-CM | POA: Diagnosis not present

## 2020-02-03 DIAGNOSIS — Z8349 Family history of other endocrine, nutritional and metabolic diseases: Secondary | ICD-10-CM | POA: Diagnosis not present

## 2020-02-03 DIAGNOSIS — R1011 Right upper quadrant pain: Secondary | ICD-10-CM | POA: Diagnosis present

## 2020-02-03 DIAGNOSIS — Z13220 Encounter for screening for lipoid disorders: Secondary | ICD-10-CM | POA: Diagnosis not present

## 2020-02-03 DIAGNOSIS — L989 Disorder of the skin and subcutaneous tissue, unspecified: Secondary | ICD-10-CM

## 2020-02-03 DIAGNOSIS — Z800 Family history of malignant neoplasm of digestive organs: Secondary | ICD-10-CM | POA: Diagnosis not present

## 2020-02-03 DIAGNOSIS — Z0001 Encounter for general adult medical examination with abnormal findings: Secondary | ICD-10-CM

## 2020-02-03 DIAGNOSIS — R102 Pelvic and perineal pain: Secondary | ICD-10-CM

## 2020-02-03 DIAGNOSIS — R1013 Epigastric pain: Secondary | ICD-10-CM | POA: Diagnosis not present

## 2020-02-03 DIAGNOSIS — R222 Localized swelling, mass and lump, trunk: Secondary | ICD-10-CM | POA: Diagnosis not present

## 2020-02-03 MED ORDER — OMEPRAZOLE 40 MG PO CPDR: 40.0000 mg | DELAYED_RELEASE_CAPSULE | Freq: Two times a day (BID) | ORAL | 3 refills | Status: DC

## 2020-02-03 NOTE — Patient Instructions (Signed)
Cuidados preventivos en las mujeres de 21 a 39 aos de edad Preventive Care 21-28 Years Old, Female Los cuidados preventivos hacen referencia a las opciones en cuanto a las visitas al mdico y al estilo de vida, las cuales pueden promover la salud y el bienestar. Esto puede comprender lo siguiente:  Un examen fsico anual. Esto tambin se puede llamar control de bienestar anual.  Visitas regulares al dentista y exmenes oculares.  Vacunas.  Estudios para detectar ciertas enfermedades.  Opciones saludables de estilo de vida, como seguir una dieta saludable, hacer ejercicio regularmente, no usar drogas ni productos que contengan nicotina y tabaco, y limitar el consumo de alcohol. Qu puedo esperar para mi visita de cuidado preventivo? Examen fsico El mdico revisar lo siguiente:  Estatura y peso. Esto se puede usar para calcular el ndice de masa corporal (IMC), que indica si tiene un peso saludable.  Frecuencia cardaca y presin arterial.  Piel para detectar manchas anormales. Asesoramiento Su mdico puede preguntarle acerca de:  Consumo de tabaco, alcohol y drogas.  Su bienestar emocional.  El bienestar en el hogar y sus relaciones personales.  Su actividad sexual.  Sus hbitos de alimentacin.  Su trabajo y ambiente laboral.  Mtodos anticonceptivos.  Su ciclo menstrual.  Sus antecedentes de embarazo. Qu vacunas necesito?  Vacuna antigripal  Se recomienda aplicarse esta vacuna todos los aos. Vacuna contra el ttanos, difteria y tos ferina (Tdap)  Es posible que tenga que aplicarse un refuerzo contra el ttanos y la difteria (DT) cada 10aos. Vacuna contra la varicela  Es posible que tenga que aplicrsela si no recibi esta vacuna. Vacuna contra el virus del papiloma humano (VPH)  Si el mdico se lo recomienda, puede necesitar tres dosis a lo largo de 6 meses. Vacuna contra el sarampin, rubola y paperas (SRP)  Tendr que aplicarse por lo menos una  dosis de la SRP. Podra tambin necesitar una segunda dosis. Vacuna antimeningoccica conjugada (MenACWY)  Se recomienda la aplicacin de una dosis si tiene entre 19 y 21aos, y es estudiante universitario de primer ao que vive en una residencia estudiantil, o si tiene una de varias enfermedades. Podra tambin necesitar dosis de refuerzo. Vacuna antineumoccica conjugada (PCV13)  Puede necesitar esta vacuna si tiene determinadas enfermedades y no se vacun anteriormente. Vacuna antineumoccica de polisacridos (PPSV23)  Quizs tenga que aplicarse una o dos dosis si fuma o si tiene determinadas afecciones. Vacuna contra la hepatitis A  Es posible que necesite esta vacuna si tiene ciertas afecciones o si viaja o trabaja en lugares en los que podra estar expuesta a la hepatitis A. Vacuna contra la hepatitis B  Es posible que necesite esta vacuna si tiene ciertas afecciones o si viaja o trabaja en lugares en los que podra estar expuesta a la hepatitis B. Vacuna antihaemophilus influenzae tipo B (Hib)  Puede necesitar esta vacuna si tiene determinadas afecciones. Puede recibir las vacunas en forma de dosis individuales o en forma de dos o ms vacunas juntas en la misma inyeccin (vacunas combinadas). Hable con su mdico sobre los riesgos y beneficios de las vacunas combinadas. Qu pruebas necesito?  Anlisis de sangre  Niveles de lpidos y colesterol. Estos se pueden verificar cada 5 aos, a partir de los 20 aos de edad.  Anlisis de hepatitisC.  Anlisis de hepatitisB. Pruebas de deteccin  Pruebas de deteccin de la diabetes. Esto se realiza mediante un control del azcar en la sangre (glucosa) despus de no haber comido durante un periodo de tiempo (ayuno).    Anlisis de enfermedades de transmisin sexual (ETS).  Pruebas de deteccin de cncer relacionado con las mutaciones del BRCA. Es posible que se las deba realizar si tiene antecedentes de cncer de mama, de ovario, de  trompas o peritoneal.  Examen plvico y prueba de Papanicolaou. Esto se puede realizar cada 3aos a partir de los 21aos de edad. A partir de los 30 aos, esto se puede realizar cada 5 aos si usted se realiza una prueba de Papanicolaou en combinacin con una prueba de deteccin del virus del papiloma humano (VPH). Hable con su mdico sobre los resultados de las pruebas, las opciones de tratamiento y, si corresponde, la necesidad de realizar ms pruebas. Siga estas instrucciones en su casa: Comida y bebida   Siga una dieta que incluya frutas frescas y verduras, cereales integrales, lcteos descremados y protenas magras.  Tome los suplementos vitamnicos y minerales como se lo haya indicado el mdico.  No beba alcohol si: ? Su mdico le indica no hacerlo. ? Est embarazada, puede estar embarazada o est tratando de quedar embarazada.  Si bebe alcohol: ? Limite la cantidad que consume de 0 a 1 medida por da. ? Est atenta a la cantidad de alcohol que hay en las bebidas que toma. En [place], una medida equivale a una botella de cerveza de 12oz (355ml), un vaso de vino de 5oz (148ml) o un vaso de una bebida alcohlica de alta graduacin de 1oz (44ml). Estilo de vida  Cudese los dientes y las encas a diario.  Mantngase activa. Haga al menos 30minutos de ejercicio 5o ms das de la semana.  No consuma ningn producto que contenga nicotina o tabaco, como cigarrillos, cigarrillos electrnicos y tabaco de mascar. Si necesita ayuda para dejar de fumar, consulte al mdico.  Si es sexualmente activa, practique sexo seguro. Use un condn u otra forma de mtodo anticonceptivo (anticonceptivos) a fin de evitar un embarazo e ITS (infecciones de transmisin sexual). Si busca un embarazo, realice una consulta previa a la concepcin con el mdico. Cundo volver?  Visite al mdico una vez al ao para una visita de control.  Pregntele al mdico con qu frecuencia debe  realizarse un control de la vista y los dientes.  Mantenga su esquema de vacunacin al da. Esta informacin no tiene como fin reemplazar el consejo del mdico. Asegrese de hacerle al mdico cualquier pregunta que tenga. Document Revised: 08/13/2018 Document Reviewed: 08/13/2018 Elsevier Patient Education  2020 Elsevier Inc.  

## 2020-02-03 NOTE — Progress Notes (Signed)
Subjective:  Patient ID: Rebecca Nash, female    DOB: 1991/10/23  Age: 29 y.o. MRN: [id number]  CC: No chief complaint on file.   HPI Rebecca Nash , 29 yo female, who presents for annual well exam. On review of chart, no prior pap results. Last Hgb A1c in Oct of 2019 was normal at 5.2.         At today's visit, patient reports that she is here for annual well exam.  She believes that her last Pap was in the last 2 to 3 years and was normal.  She has several other complaints at today's visit.  She has had about 2 months of mid upper abdominal pain which worsens after eating.  She reports that she gets pain about 30 minutes after eating and is now reluctant to eat at times as this will cause the onset of the pain.  She also has symptoms of acid reflux.  She reports backwash of a bad tasting fluid into the mouth and throat.  She also has complaint of a nodule along the left lower rib cage that she believes is a little bigger than what it was last evaluate.  She has had some mild lower abdominal discomfort.  She denies any urinary frequency or dysuria.  She denies any pelvic pain and no vaginal discharge. She also reports a strong family history of different types of cancer including her mother with history of colon cancer.  Patient is nonfasting at today's visit.          Patient reports that she has had some left-sided breast/axillary tenderness.  She does tend to drink a lot of coffee.  She did breast-feed.  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  Surgical history is significant for tubal ligation.  Menses occur regularly and last menses was approximately 2 weeks ago.  Past Medical History:  Diagnosis Date  . Depression    was on meds prior to moving here post partum  . Late prenatal care     Past Surgical History:  Procedure Laterality Date  . TUBAL LIGATION Bilateral 01/28/2018   Procedure: POST PARTUM TUBAL  LIGATION;  Surgeon: Kathrynn Running, MD;  Location: [facility];  Service: Gynecology;  Laterality: Bilateral;    Family History  Problem Relation Age of Onset  . Other Other        Moebius syndrome  . Hypertension Mother   . Colon cancer Mother   . Cervical cancer Mother   . Diabetes Mother   . Hyperlipidemia Mother   . Anemia Daughter        blood transfusion at 61mos  . Vision loss Daughter        eye surgery  . Other Daughter        Sotos syndrome  . Hypertension Father   . Diabetes Father     Social History   Tobacco Use  . Smoking status: Never Smoker  . Smokeless tobacco: Never Used  Substance Use Topics  . Alcohol use: No    ROS Review of Systems  Constitutional: Positive for fatigue. Negative for chills and fever.  HENT: Negative for sore throat and trouble swallowing.   Eyes: Negative for photophobia and visual disturbance.  Respiratory: Negative for cough and shortness of breath.   Cardiovascular: Negative for chest pain, palpitations and leg swelling.  Gastrointestinal: Positive for abdominal pain. Negative for blood in stool,  constipation, diarrhea and nausea.  Endocrine: Negative for cold intolerance, heat intolerance, polydipsia, polyphagia and polyuria.  Genitourinary: Negative for dysuria, frequency, menstrual problem, pelvic pain, vaginal bleeding, vaginal discharge and vaginal pain.  Musculoskeletal: Negative for arthralgias and back pain.  Skin: Negative for rash and wound.  Allergic/Immunologic: Negative for food allergies and immunocompromised state.  Neurological: Negative for dizziness and headaches.  Hematological: Negative for adenopathy. Does not bruise/bleed easily.  Psychiatric/Behavioral: Negative for self-injury and suicidal ideas.    Objective:   Today's Vitals: BP 102/71 (BP Location: Left Arm, Patient Position: Sitting, Cuff Size: Large)   Pulse 68   Ht 5' (1.524 m)   Wt 147 lb 6.4 oz (66.9 kg)   SpO2 100%   BMI 28.79  kg/m   Physical Exam Vitals and nursing note reviewed. Chaperone present: CMA present during pelvic exam.  Constitutional:      Appearance: Normal appearance.  Neck:     Vascular: No carotid bruit.     Comments: No palpable thyromegaly Cardiovascular:     Rate and Rhythm: Normal rate and regular rhythm.  Pulmonary:     Effort: Pulmonary effort is normal.     Breath sounds: Normal breath sounds.  Chest:       Comments: Patient with increased tissue density/likely fibrous/ductal tissue in both breast.  No nipple discharge.  No skin changes.  Patient with tenderness to palpation in the left lateral breast/axilla Abdominal:     Palpations: Abdomen is soft.     Tenderness: There is abdominal tenderness. There is no right CVA tenderness, left CVA tenderness, guarding or rebound.     Comments: Patient with epigastric tenderness to palpation as well as mild right upper quadrant discomfort to palpation.  She also has some right lower quadrant/suprapubic discomfort to palpation.  No rebound or guarding  Genitourinary:    Comments: Patient has some slight deformity of the inferior cervix/possible nabothian cyst.  Patient with complaint of right adnexal tenderness on examination, no cervical motion tenderness and no left adnexal tenderness.  No appreciable adnexal masses. Musculoskeletal:        General: No tenderness.     Cervical back: Normal range of motion and neck supple. No tenderness.     Right lower leg: No edema.     Left lower leg: No edema.  Lymphadenopathy:     Cervical: No cervical adenopathy.  Skin:    General: Skin is warm and dry.     Findings: Lesion present.     Comments: Patient with a large, dark slightly pedunculated mole on the inferior sternum/mid chest.  Patient also with palpable compressible and mobile subcutaneous mass on the left lateral chest wall/rib cage  Neurological:     General: No focal deficit present.     Mental Status: She is alert and oriented to  person, place, and time.  Psychiatric:        Mood and Affect: Mood normal.        Behavior: Behavior normal.     Assessment & Plan:  1. Encounter for preventative adult health care examination Patient with preventative adult health care examination with abnormal findings including left breast/axillary tenderness as well as right adnexal tenderness.  She will be scheduled for left breast ultrasound and pelvic ultrasound complete.  Pap smear also done at today's visit.  Educational material on preventative care for patient's age group provided at today's visit.  2. Epigastric pain Patient with complaint of epigastric pain for approximately 2 months that is worse  within about 30 minutes of eating.  Will look for H. pylori antibodies, check lipase and CBC in follow-up of her epigastric pain.  She has been referred to gastroenterology for further evaluation and treatment.  In the interim prescription provided for omeprazole 40 mg twice daily to reduce stomach acid in case of gastritis/severe acid reflux as the cause of her epigastric pain - Comprehensive metabolic panel; Future - H Pylori, IGM, IGG, IGA AB - Lipase - CBC - Ambulatory referral to Gastroenterology - omeprazole (PRILOSEC) 40 MG capsule; Take 1 capsule (40 mg total) by mouth 2 (two) times daily. To decrease stomach acid  Dispense: 60 capsule; Refill: 3  3. Right upper quadrant pain Will obtain right upper quadrant ultrasound to look for gallstones as patient is status post childbirth x3 and has right upper quadrant pain on examination.  We will also check comprehensive metabolic panel, CBC and patient will be referred to gastroenterology for further evaluation and treatment - Comprehensive metabolic panel - CBC - US Abdomen Limited RUQ; Future - Ambulatory referral to Gastroenterology  4. Skin lesion Patient with skin lesion on the mid chest wall/inferior sternum that needs removal/pathology and patient will be referred to  general surgery - Ambulatory referral to General Surgery  5. Subcutaneous nodule of chest wall Patient with subcutaneous nodule of the left lateral chest wall/rib area.  Discussed with patient that this likely represents a lipoma but she feels that it is getting bigger and she would like to have further evaluation.  She will be referred to general surgery - Ambulatory referral to General Surgery  6. Breast tenderness in female Patient with complaint of breast tenderness in the left lateral breast and axilla.  I suspect that her symptoms may be related to cystic breast disorder as she does report a significant amount of caffeine intake.  She will be scheduled for an ultrasound of the left breast/axilla - US BREAST COMPLETE UNI LEFT INC AXILLA; Future  7. Screening for cervical cancer Pap done at today's visit as a screening test for cervical cancer she will be notified of the results and if any further treatment is needed based on these results.  It was also discussed with the patient that current recommendation is for 3-year repeat of Pap smear if prior Pap smears have been normal - Cytology - PAP  8. Screening for lipid disorders She is nonfasting at today's visit and she will return for fasting lipid panel - Lipid panel; Future  9. Right adnexal tenderness Patient with right adnexal tenderness on bimanual exam and she will be scheduled for pelvic ultrasound - US Pelvic Complete With Transvaginal; Future   Outpatient Encounter Medications as of 02/03/2020  Medication Sig  . hydrocortisone cream 0.5 % Apply 1 application topically 2 (two) times daily. As needed for dry skin rash  . ibuprofen (ADVIL,MOTRIN) 600 MG tablet Take 1 tablet (600 mg total) by mouth every 8 (eight) hours as needed for moderate pain.  Marland Kitchen nystatin cream (MYCOSTATIN) Apply to affected area 2 times daily PRN. Apply directly to breast nipple.  . polyethylene glycol powder (GLYCOLAX/MIRALAX) powder Take 255 g by mouth  daily. As needed for constipation (Patient not taking: Reported on 03/12/2019)  . Prenatal Vit w/Fe-Methylfol-FA (PNV PO) Take by mouth.   No facility-administered encounter medications on file as of 02/03/2020.    An After Visit Summary was printed and given to the patient.   Follow-up: Return in about 4 weeks (around 03/02/2020) for abdominal pain/acute issues.  More  than 40 minutes of face-to-face time was spent with the patient at today's visit with additional 15 minutes to add orders, referrals, medications, labs as well as review of chart and creation of today's visit note   Annleigh Knueppel MD

## 2020-02-03 NOTE — Progress Notes (Signed)
Per pt she only wants to do physical today   Per pt she whenever she eats her stomach hurt and it hurts all the time

## 2020-02-04 LAB — CYTOLOGY - PAP: Diagnosis: NEGATIVE

## 2020-02-05 LAB — COMPREHENSIVE METABOLIC PANEL WITH GFR
ALT: 19 IU/L (ref 0–32)
AST: 18 IU/L (ref 0–40)
Albumin/Globulin Ratio: 1.6 (ref 1.2–2.2)
Albumin: 4.2 g/dL (ref 3.9–5.0)
Alkaline Phosphatase: 36 IU/L — ABNORMAL LOW (ref 39–117)
BUN/Creatinine Ratio: 14 (ref 9–23)
BUN: 10 mg/dL (ref 6–20)
Bilirubin Total: <0.2 mg/dL (ref 0.0–1.2)
CO2: 22 mmol/L (ref 20–29)
Calcium: 9.3 mg/dL (ref 8.7–10.2)
Chloride: 107 mmol/L — ABNORMAL HIGH (ref 96–106)
Creatinine, Ser: 0.7 mg/dL (ref 0.57–1.00)
GFR calc Af Amer: 136 mL/min/1.73
GFR calc non Af Amer: 118 mL/min/1.73
Globulin, Total: 2.7 g/dL (ref 1.5–4.5)
Glucose: 86 mg/dL (ref 65–99)
Potassium: 4.3 mmol/L (ref 3.5–5.2)
Sodium: 139 mmol/L (ref 134–144)
Total Protein: 6.9 g/dL (ref 6.0–8.5)

## 2020-02-05 LAB — CBC
Hematocrit: 40 % (ref 34.0–46.6)
Hemoglobin: 12.7 g/dL (ref 11.1–15.9)
MCH: 28.9 pg (ref 26.6–33.0)
MCHC: 31.8 g/dL (ref 31.5–35.7)
MCV: 91 fL (ref 79–97)
Platelets: 198 x10E3/uL (ref 150–450)
RBC: 4.39 x10E6/uL (ref 3.77–5.28)
RDW: 11.7 % (ref 11.7–15.4)
WBC: 6.7 x10E3/uL (ref 3.4–10.8)

## 2020-02-05 LAB — H PYLORI, IGM, IGG, IGA AB
H pylori, IgM Abs: <9 U (ref 0.0–8.9)
H. pylori, IgA Abs: 15.5 U — ABNORMAL HIGH (ref 0.0–8.9)
H. pylori, IgG AbS: >9.4 {index_val} — ABNORMAL HIGH (ref 0.00–0.79)

## 2020-02-05 LAB — LIPASE: Lipase: 36 U/L (ref 14–72)

## 2020-02-06 ENCOUNTER — Other Ambulatory Visit: Payer: Self-pay | Admitting: Family Medicine

## 2020-02-06 DIAGNOSIS — R7689 Other specified abnormal immunological findings in serum: Secondary | ICD-10-CM

## 2020-02-06 DIAGNOSIS — R768 Other specified abnormal immunological findings in serum: Secondary | ICD-10-CM

## 2020-02-06 MED ORDER — AMOXICILLIN 500 MG PO CAPS: 1000.0000 mg | ORAL_CAPSULE | Freq: Two times a day (BID) | ORAL | 0 refills | Status: DC

## 2020-02-06 MED ORDER — CLARITHROMYCIN 500 MG PO TABS: 500.0000 mg | ORAL_TABLET | Freq: Two times a day (BID) | ORAL | 0 refills | Status: DC

## 2020-02-14 ENCOUNTER — Other Ambulatory Visit: Payer: Medicaid Other

## 2020-02-15 ENCOUNTER — Telehealth: Payer: Self-pay | Admitting: Family Medicine

## 2020-02-15 NOTE — Telephone Encounter (Signed)
Error

## 2020-02-16 ENCOUNTER — Encounter: Payer: Self-pay | Admitting: Physician Assistant

## 2020-02-16 ENCOUNTER — Ambulatory Visit: Payer: Medicaid Other | Admitting: Physician Assistant

## 2020-02-16 VITALS — BP 100/60 | HR 64 | Temp 98.3°F | Ht 60.0 in | Wt 144.0 lb

## 2020-02-16 DIAGNOSIS — A048 Other specified bacterial intestinal infections: Secondary | ICD-10-CM | POA: Diagnosis not present

## 2020-02-16 DIAGNOSIS — R1013 Epigastric pain: Secondary | ICD-10-CM

## 2020-02-16 DIAGNOSIS — R112 Nausea with vomiting, unspecified: Secondary | ICD-10-CM

## 2020-02-16 MED ORDER — METRONIDAZOLE 250 MG PO TABS: 250.0000 mg | ORAL_TABLET | Freq: Four times a day (QID) | ORAL | 0 refills | Status: AC

## 2020-02-16 MED ORDER — TETRACYCLINE HCL 500 MG PO CAPS: 500.0000 mg | ORAL_CAPSULE | Freq: Four times a day (QID) | ORAL | 0 refills | Status: DC

## 2020-02-16 NOTE — Patient Instructions (Addendum)
If you are age 28 or older, your body mass index should be between 23-30. Your Body mass index is 28.12 kg/m. If this is out of the aforementioned range listed, please consider follow up with your Primary Care Provider.  If you are age 64 or younger, your body mass index should be between 19-25. Your Body mass index is 28.12 kg/m. If this is out of the aformentioned range listed, please consider follow up with your Primary Care Provider.   Stop Amoxicillin and Clarithromycin.  Start following for H. Pylori infection Breakdown is as follows (all x 14 days, then discontinue):  Breakfast: *Bismuth subsalicylate chewable tablet (Pepto Bismol) 262 mg--    2 tablets *Tetracycline 500 mg tablet - 1 tablet *Metronidazole (Flagyl) 250 mg tablet-1 tablet *Omeprazole (Prilosec) 40 mg-1 tablet  Lunch: *Bismuth subsalicylate chewable tablet (Pepto Bismol) 262 mg--    2 tablets *Tetracycline 500 mg tablet - 1 tablet  Dinner: *Bismuth subsalicylate chewable tablet (Pepto Bismol) 262 mg--    2 tablets *Tetracycline 500 mg tablet - 1 tablet *Metronidazole (Flagyl) 250 mg tablet-1 tablet *Omeprazole (Prilosec) 40 mg-1 tablet  Bedtime: *Bismuth subsalicylate chewable tablet (Pepto Bismol) 262 mg--   2 tablets *Tetracycline 500 mg tablet - 1 tablet *Metronidazole (Flagyl) 250 mg tablet - 1 tablet  Follow up in 6-8 weeks for lab test only.

## 2020-02-16 NOTE — Progress Notes (Signed)
____________________________________________________________  Attending physician addendum:  Thank you for sending this case to me. I have reviewed the entire note, and the outlined plan seems appropriate.  I agree with changing her antibiotic regimen as you outlined. My only suggestion would be to repeat the H. pylori testing a little sooner, 4 to 6 weeks will be fine.  Usually 2 weeks after completing antibiotic treatment will be sufficient.  Amada Jupiter, MD  ____________________________________________________________

## 2020-02-16 NOTE — Progress Notes (Signed)
Chief Complaint: Epigastric pain with nausea and vomiting  HPI:    Rebecca Nash is a 29 year old Hispanic female with a past medical history as listed below, who was referred to me by Cain Saupe, MD for a complaint of epigastric pain with nausea and vomiting.      02/03/2020 patient seen in clinic by her PCP and reported upper abdominal pain which was worse after eating about 30 minutes with acid reflux.  CMP and H. pylori studies as well as a CBC and lipase were ordered.  Also started on Prilosec 40 mg twice a day.  Right upper quadrant ultrasound also ordered (this is scheduled for/9/21).  Patient was found to be positive for H. pylori IgG antibodies.  She was given amoxicillin and metronidazole twice daily for 10 days along with twice daily omeprazole.    Today, patient presents with interpreter, the patient tells me that she has been started on treatment for H. pylori as of 02/13/2020.  Tells me that the antibiotics are giving her diarrhea but she has just started to feel slightly better over the past 24 hours as far as her nausea, vomiting and epigastric pain.  Her reflux is also improving.    Denies fever, chills, weight loss or blood in her stool.  Past Medical History:  Diagnosis Date  . Depression    was on meds prior to moving here post partum  . Late prenatal care     Past Surgical History:  Procedure Laterality Date  . TUBAL LIGATION Bilateral 01/28/2018   Procedure: POST PARTUM TUBAL LIGATION;  Surgeon: Kathrynn Running, MD;  Location: [facility] BIRTHING SUITES;  Service: Gynecology;  Laterality: Bilateral;    Current Outpatient Medications  Medication Sig Dispense Refill  . amoxicillin (AMOXIL) 500 MG capsule Take 2 capsules (1,000 mg total) by mouth 2 (two) times daily for 10 days. 40 capsule 0  . clarithromycin (BIAXIN) 500 MG tablet Take 1 tablet (500 mg total) by mouth 2 (two) times daily for 10 days. 20 tablet 0  . ibuprofen (ADVIL,MOTRIN) 600 MG tablet Take 1  tablet (600 mg total) by mouth every 8 (eight) hours as needed for moderate pain. 30 tablet 0  . omeprazole (PRILOSEC) 40 MG capsule Take 1 capsule (40 mg total) by mouth 2 (two) times daily. To decrease stomach acid 60 capsule 3   No current facility-administered medications for this visit.    Allergies as of 02/16/2020  . (No Known Allergies)    Family History  Problem Relation Age of Onset  . Other Other        Moebius syndrome  . Hypertension Mother   . Colon cancer Mother   . Cervical cancer Mother   . Diabetes Mother   . Hyperlipidemia Mother   . Anemia Daughter        blood transfusion at 92mos  . Vision loss Daughter        eye surgery  . Other Daughter        Sotos syndrome  . Hypertension Father   . Diabetes Father     Social History   Socioeconomic History  . Marital status: Married    Spouse name: Not on file  . Number of children: Not on file  . Years of education: Not on file  . Highest education level: Not on file  Occupational History  . Not on file  Tobacco Use  . Smoking status: Never Smoker  . Smokeless tobacco: Never Used  Substance and Sexual  Activity  . Alcohol use: No  . Drug use: No  . Sexual activity: Yes    Birth control/protection: None  Other Topics Concern  . Not on file  Social History Narrative  . Not on file   Social Determinants of Health   Financial Resource Strain:   . Difficulty of Paying Living Expenses:   Food Insecurity:   . Worried About Charity fundraiser in the Last Year:   . Arboriculturist in the Last Year:   Transportation Needs:   . Film/video editor (Medical):   Marland Kitchen Lack of Transportation (Non-Medical):   Physical Activity:   . Days of Exercise per Week:   . Minutes of Exercise per Session:   Stress:   . Feeling of Stress :   Social Connections:   . Frequency of Communication with Friends and Family:   . Frequency of Social Gatherings with Friends and Family:   . Attends Religious Services:   .  Active Member of Clubs or Organizations:   . Attends Archivist Meetings:   Marland Kitchen Marital Status:   Intimate Partner Violence:   . Fear of Current or Ex-Partner:   . Emotionally Abused:   Marland Kitchen Physically Abused:   . Sexually Abused:     Review of Systems:    Constitutional: No weight loss, fever or chills Skin: No rash  Cardiovascular: No chest pain Respiratory: No SOB  Gastrointestinal: See HPI and otherwise negative Genitourinary: No dysuria Neurological: No headache, dizziness or syncope Musculoskeletal: No new muscle or joint pain Hematologic: No bleeding  Psychiatric: No history of depression or anxiety   Physical Exam:  Vital signs: BP 100/60   Pulse 64   Temp 98.3 F (36.8 C)   Ht 5' (1.524 m)   Wt 144 lb (65.3 kg)   LMP 01/24/2020 (Approximate)   BMI 28.12 kg/m   Constitutional:   Pleasant Hispanic female appears to be in NAD, Well developed, Well nourished, alert and cooperative Head:  Normocephalic and atraumatic. Eyes:   PEERL, EOMI. No icterus. Conjunctiva pink. Ears:  Normal auditory acuity. Neck:  Supple Throat: Oral cavity and pharynx without inflammation, swelling or lesion.  Respiratory: Respirations even and unlabored. Lungs clear to auscultation bilaterally.   No wheezes, crackles, or rhonchi.  Cardiovascular: Normal S1, S2. No MRG. Regular rate and rhythm. No peripheral edema, cyanosis or pallor.  Gastrointestinal:  Soft, nondistended, mild epigastric ttp, No rebound or guarding. Normal bowel sounds. No appreciable masses or hepatomegaly. Rectal:  Not performed.  Msk:  Symmetrical without gross deformities. Without edema, no deformity or joint abnormality.  Neurologic:  Alert and  oriented x4;  grossly normal neurologically.  Skin:   Dry and intact without significant lesions or rashes. Psychiatric: Demonstrates good judgement and reason without abnormal affect or behaviors.  RELEVANT LABS AND IMAGING: CBC    Component Value Date/Time   WBC  6.7 02/03/2020 0938   WBC 10.4 01/29/2018 0620   RBC 4.39 02/03/2020 0938   RBC 3.81 (L) 01/29/2018 0620   HGB 12.7 02/03/2020 0938   HCT 40.0 02/03/2020 0938   PLT 198 02/03/2020 0938   MCV 91 02/03/2020 0938   MCH 28.9 02/03/2020 0938   MCH 30.2 01/29/2018 0620   MCHC 31.8 02/03/2020 0938   MCHC 34.0 01/29/2018 0620   RDW 11.7 02/03/2020 0938   LYMPHSABS 1.9 12/21/2018 1123   EOSABS 0.2 12/21/2018 1123   BASOSABS 0.0 12/21/2018 1123    CMP  Component Value Date/Time   NA 139 02/03/2020 0938   K 4.3 02/03/2020 0938   CL 107 (H) 02/03/2020 0938   CO2 22 02/03/2020 0938   GLUCOSE 86 02/03/2020 0938   GLUCOSE 83 05/13/2016 0000   BUN 10 02/03/2020 0938   CREATININE 0.70 02/03/2020 0938   CALCIUM 9.3 02/03/2020 0938   PROT 6.9 02/03/2020 0938   ALBUMIN 4.2 02/03/2020 0938   AST 18 02/03/2020 0938   ALT 19 02/03/2020 0938   ALKPHOS 36 (L) 02/03/2020 0938   BILITOT <0.2 02/03/2020 0938   GFRNONAA 118 02/03/2020 0938   GFRAA 136 02/03/2020 0938    Assessment: 1.  H pylori: Started treatment 3 days ago with only Amoxicillin and Metronidazole and Omeprazole, antibiotics are giving her diarrhea but pain is improving 2.  Epigastric pain, nausea and vomiting: Thought related to above  Plan: 1.  Discussed that I would prefer a different treatment for the patient.  Quadruple therapy has been proven to be more successful and is now first-line.  Told the patient to continue Omeprazole 40 mg twice daily but stopped her Amoxicillin.  She will also take Metronidazole 250 mg 4 times daily and Bismuth subsalicylate 300 mg 4 times daily as well as Tetracycline 500 mg 4 times daily for total of 14 days. 2.  Patient will undergo H. pylori breath testing in 6-8 weeks to ensure eradication.  She must stop her PPI and be off of it for 2 weeks prior to testing. 3.  Also provided work note for the patient for this week.  She will go back to work on Monday. 4.  Patient will follow in clinic per  recommendations after testing.  She was assigned to Dr. Myrtie Neither this afternoon.  Rebecca Meeker, PA-C [facility] 02/16/2020, 2:56 PM  Cc: Cain Saupe, MD

## 2020-02-17 ENCOUNTER — Telehealth: Payer: Self-pay

## 2020-02-17 NOTE — Telephone Encounter (Signed)
The pt has been advised and will come in 4-6 weeks for repeat lab test

## 2020-02-17 NOTE — Progress Notes (Signed)
Dr. Myrtie Neither would like h.pylor testing a little sooner- 4-6 weeks, can you change order and let patient know? She is spanish speaking.  Thanks-JLL

## 2020-02-17 NOTE — Telephone Encounter (Signed)
-----   Message from Unk Lightning, [place] sent at 02/17/2020 10:18 AM EDT -----   ----- Message ----- From: Sherrilyn Rist, MD Sent: 02/16/2020   4:51 PM EDT To: Unk Lightning, PA    ----- Message ----- From: Unk Lightning, [place] Sent: 02/16/2020   3:46 PM EDT To: Sherrilyn Rist, MD

## 2020-02-17 NOTE — Telephone Encounter (Signed)
Author: Unk Lightning, PA Service: Gastroenterology Author Type: Physician Assistant  Filed: 02/17/2020 10:19 AM Encounter Date: 02/16/2020 Status: Signed  Editor: Unk Lightning, PA (Physician Assistant)     Show:Clear all [x] Manual[] Template[] Copied  Added by: [x] , PA  [] Hover for details Dr. would like h.pylor testing a little sooner- 4-6 weeks, can you change order and let patient know? She is spanish speaking.  Thanks-JLL

## 2020-02-18 ENCOUNTER — Other Ambulatory Visit: Payer: Self-pay

## 2020-02-18 ENCOUNTER — Ambulatory Visit
Admission: RE | Admit: 2020-02-18 | Discharge: 2020-02-18 | Disposition: A | Discharge status: Home / Self Care | Payer: Medicaid Other | Source: Ambulatory Visit | Attending: Family Medicine | Admitting: Family Medicine

## 2020-02-18 ENCOUNTER — Other Ambulatory Visit: Payer: Self-pay | Admitting: Family Medicine

## 2020-02-18 ENCOUNTER — Telehealth: Payer: Self-pay | Admitting: *Deleted

## 2020-02-18 DIAGNOSIS — R1011 Right upper quadrant pain: Secondary | ICD-10-CM

## 2020-02-18 MED ORDER — PYLERA 140-125-125 MG PO CAPS: 3.0000 | ORAL_CAPSULE | Freq: Three times a day (TID) | ORAL | 0 refills | Status: DC

## 2020-02-18 MED ORDER — DOXYCYCLINE HYCLATE 100 MG PO TBEC: 100.0000 mg | DELAYED_RELEASE_TABLET | Freq: Two times a day (BID) | ORAL | 0 refills | Status: AC

## 2020-02-18 NOTE — Telephone Encounter (Signed)
Medicaid is still requesting a prior auth for Doxycycline. After looking at the preferred list for Medicaid, Pylera is a preferred choice. Can I change to Pylera?

## 2020-02-18 NOTE — Telephone Encounter (Signed)
Patient insurance will not cover Tetracycline. Please advise on change. Thank you

## 2020-02-18 NOTE — Telephone Encounter (Signed)
Yes, Pylera is essentially the same therapy that was prescribed.  We usually do not prescribe it because uncommonly covered by insurance.  But if Medicaid indicates it is covered, then yes, please make the change.

## 2020-02-18 NOTE — Telephone Encounter (Signed)
Pylera has been sent to the pharmacy

## 2020-02-18 NOTE — Telephone Encounter (Signed)
Informed patient of switch in treatment for H. Pylori. Informed patient to take the Pylera and Omeprazole as directed only. Patient voiced understanding.

## 2020-02-18 NOTE — Telephone Encounter (Signed)
Herbert Seta,  That is unfortunate, since it is the preferred antibiotic for this regimen.  Please cancel the prescription for tetracycline and, as an alternative, send a prescription for doxycycline 100 mg twice daily for 14 days.   (Direct further medication inquiries to JL, who saw this patient for recent clinic visit) -----------------------------  JL,  This was a clinic patient you saw.  Made a change in the antibiotic.  FYI, you are correct the tetracycline is part of the bismuth based quadruple therapy, but is often not available.  - HD

## 2020-02-18 NOTE — Telephone Encounter (Signed)
Sent script for Doxycyline to pharmacy. Informed patient of change.

## 2020-02-22 ENCOUNTER — Other Ambulatory Visit: Payer: Self-pay

## 2020-02-22 ENCOUNTER — Ambulatory Visit
Admission: RE | Admit: 2020-02-22 | Discharge: 2020-02-22 | Disposition: A | Discharge status: Home / Self Care | Payer: Medicaid Other | Source: Ambulatory Visit | Attending: Family Medicine | Admitting: Family Medicine

## 2020-02-22 DIAGNOSIS — R102 Pelvic and perineal pain: Secondary | ICD-10-CM

## 2020-02-28 ENCOUNTER — Ambulatory Visit
Admission: RE | Admit: 2020-02-28 | Discharge: 2020-02-28 | Disposition: A | Discharge status: Home / Self Care | Payer: Medicaid Other | Source: Ambulatory Visit | Attending: Family Medicine | Admitting: Family Medicine

## 2020-02-28 ENCOUNTER — Other Ambulatory Visit: Payer: Self-pay

## 2020-02-28 ENCOUNTER — Encounter: Payer: Self-pay | Admitting: *Deleted

## 2020-02-28 DIAGNOSIS — N644 Mastodynia: Secondary | ICD-10-CM

## 2020-04-26 ENCOUNTER — Ambulatory Visit: Payer: Self-pay | Admitting: General Surgery

## 2020-04-26 NOTE — H&P (Signed)
  History of Present Illness Rebecca Filler MD; 04/26/2020 10:50 AM) The patient is a 29 year old female who presents with a complaint of mole, left costal margin mass. Patient is a 29 year old Spanish-speaking female who comes in with a left costal margin mass that states that has gotten larger. She states that this started off small and has increased in size. She states this causes her some discomfort and pain. She states there is been no drainage from the area or infection.  She's also had a small mole in the supra xiphoid area. She states that this has not changed or gotten any larger. She would also like to have this removed.  Patient states that she has a mother with both scan in cervical cancer. Her aunt has had scan and cervical cancer.     Allergies Rebecca Florida, Rebecca Nash; 04/26/2020 10:40 AM) No Known Allergies  [04/26/2020]: Allergies Reconciled   Medication History (Rebecca Herrin, Rebecca Nash; 04/26/2020 10:41 AM) Omeprazole (40MG  Capsule DR, Oral) Active. Medications Reconciled    Review of Systems , MD; 04/26/2020 10:52 AM) All other systems negative  Vitals (Rebecca Herrin Rebecca Nash; 04/26/2020 10:42 AM) 04/26/2020 10:41 AM Weight: 142.5 lb Height: 60in Body Surface Area: 1.62 m Body Mass Index: 27.83 kg/m  Temp.: 98.28F  Pulse: 92 (Regular)  P.OX: 99% (Room air) BP: 110/70(Sitting, Left Arm, Standard)       Physical Exam 04/28/2020 MD; 04/26/2020 10:50 AM) The physical exam findings are as follows: Note: Constitutional: No acute distress, conversant, appears stated age  Eyes: Anicteric sclerae, moist conjunctiva, no lid lag  Neck: No thyromegaly, trachea midline, no cervical lymphadenopathy  Lungs: Clear to auscultation biilaterally, normal respiratory effot  Cardiovascular: regular rate & rhythm, no murmurs, no peripheal edema, pedal pulses 2+  GI: Soft, no masses or hepatosplenomegaly, non-tender to palpation  MSK: Normal gait, no  clubbing cyanosis, edema  Skin: No rashes, palpation reveals normal skin turgor left costal margin mass, approximately 2 x 3 cm in size, soft, subcutaneous She has a 1 cm mole in her subxiphoid area, appears pedunculate.  Psychiatric: Appropriate judgment and insight, oriented to person, place, and time    Assessment & Plan 04/28/2020 MD; 04/26/2020 10:52 AM) LIPOMA OF CHEST WALL (D17.1) Impression: patient is a 29 year old female with a left costal margin lipoma as well as super xiphoid mole that is 1 cm.  Patient will like to proceed to the operating room and have both these excised. We will have these sent to pathology.  I discussed with her the risks and benefits of the procedure to include but not limited to: Infection, bleeding, damage to structures, possible dehiscence of the skin, possible need further surgery. Patient voiced understanding and wished to proceed. Current Plans Schedule for Surgery SKIN MOLE (D22.9)

## 2020-07-21 ENCOUNTER — Other Ambulatory Visit: Payer: Self-pay

## 2020-07-30 ENCOUNTER — Emergency Department (HOSPITAL_COMMUNITY)
Admission: EM | Admit: 2020-07-30 | Discharge: 2020-07-30 | Disposition: A | Discharge status: Home / Self Care | Payer: Medicaid Other | Attending: Emergency Medicine | Admitting: Emergency Medicine

## 2020-07-30 ENCOUNTER — Encounter (HOSPITAL_COMMUNITY): Payer: Self-pay

## 2020-07-30 DIAGNOSIS — Z5321 Procedure and treatment not carried out due to patient leaving prior to being seen by health care provider: Secondary | ICD-10-CM | POA: Insufficient documentation

## 2020-07-30 DIAGNOSIS — T7840XA Allergy, unspecified, initial encounter: Secondary | ICD-10-CM | POA: Insufficient documentation

## 2020-07-30 NOTE — ED Triage Notes (Addendum)
Pt arrives to ED w/ c/o allergic reaction after eating scallops. Pt endorses sob and nausea. No edema, rash noted. Pt resp e/u, SPO2 100%. No hx of allergies to shellfish. Pt states she ate scallops 1 hour ago.

## 2020-07-30 NOTE — ED Notes (Signed)
Was not present during vital recheck in lobby, called for pt outside with no answer.  

## 2020-08-25 ENCOUNTER — Other Ambulatory Visit: Payer: Self-pay

## 2020-08-25 ENCOUNTER — Encounter: Payer: Self-pay | Admitting: Family Medicine

## 2020-08-25 ENCOUNTER — Ambulatory Visit: Payer: Medicaid Other | Attending: Family Medicine | Admitting: Family Medicine

## 2020-08-25 VITALS — BP 106/72 | HR 84 | Temp 98.2°F | Ht 64.0 in | Wt 144.8 lb

## 2020-08-25 DIAGNOSIS — Z91013 Allergy to seafood: Secondary | ICD-10-CM | POA: Diagnosis not present

## 2020-08-25 DIAGNOSIS — Z789 Other specified health status: Secondary | ICD-10-CM

## 2020-08-25 DIAGNOSIS — R1013 Epigastric pain: Secondary | ICD-10-CM | POA: Diagnosis not present

## 2020-08-25 DIAGNOSIS — Z603 Acculturation difficulty: Secondary | ICD-10-CM

## 2020-08-25 DIAGNOSIS — Z758 Other problems related to medical facilities and other health care: Secondary | ICD-10-CM

## 2020-08-25 MED ORDER — EPINEPHRINE 0.3 MG/0.3ML IJ SOAJ: 0.3000 mg | INTRAMUSCULAR | 11 refills | Status: DC | PRN

## 2020-08-25 NOTE — Progress Notes (Signed)
Established Patient Office Visit  Subjective:  Patient ID: Rebecca Nash, female    DOB: [birth year]  Age: 29 y.o. MRN: [id number]   AMN video interpretation system used at today's visit  CC:  Chief Complaint  Patient presents with  . Hospitalization Follow-up    HPI Rebecca Nash, 29 year old female, who is status post emergency department visit on 07/30/2020 due to an allergic reaction.  She reports that she actually left the emergency department after being there for more than 5 hours without being called back to be seen.  She reports that on 07/29/2020 she and her husband went out to dinner and had seafood.  Patient reports that she had scallops which she had never eaten in the past.  After returning home and getting ready for bed then lying down, she began to have the sensation of itchy mouth/itchy throat followed by shortness of breath and feeling as if she was having difficulty breathing and having swelling in her throat/tongue and when she looked in the mirror in her bathroom she was having generalized swelling.  She reports that she was also feeling very anxious and her hands were shaking but she was able to drink some liquids Zyrtec that was prescribed for her daughter who is allergic to cherries. patient states that she did not know what dose she should take since this was a children's medication and she drank about half of the bottle but did spill some of the medication because her hands were shaking.           She had her husband drive her to the emergency department however her swelling and sensation of shortness of breath and difficulty swallowing due to sensation of swelling in her tongue and throat had improved.  After being in the emergency department for more than 5 hours, patient left.  She denies any prior allergic type reactions to any foods, insect stings or other substances.  She believes that when she was very young, around [age], she may have had some type of  reaction but of course does not recall exactly what occurred.  She did have fish for dinner this past Sunday and started having sensation of itching in her mouth and throat and took Benadryl which she had purchased after her ED visit and her symptoms went away.  Past Medical History:  Diagnosis Date  . Depression    was on meds prior to moving here post partum  . Late prenatal care     Past Surgical History:  Procedure Laterality Date  . TUBAL LIGATION Bilateral 01/28/2018   Procedure: POST PARTUM TUBAL LIGATION;  Surgeon: Kathrynn Running, MD;  Location: [facility] BIRTHING SUITES;  Service: Gynecology;  Laterality: Bilateral;    Family History  Problem Relation Age of Onset  . Other Other        Moebius syndrome  . Hypertension Mother   . Colon cancer Mother   . Cervical cancer Mother   . Diabetes Mother   . Hyperlipidemia Mother   . Anemia Daughter        blood transfusion at 58mos  . Vision loss Daughter        eye surgery  . Other Daughter        Sotos syndrome  . Hypertension Father   . Diabetes Father     Social History   Socioeconomic History  . Marital status: Married    Spouse name: Not on file  . Number of children: Not on file  .  Years of education: Not on file  . Highest education level: Not on file  Occupational History  . Not on file  Tobacco Use  . Smoking status: Never Smoker  . Smokeless tobacco: Never Used  Vaping Use  . Vaping Use: Never used  Substance and Sexual Activity  . Alcohol use: No  . Drug use: No  . Sexual activity: Yes    Birth control/protection: None  Other Topics Concern  . Not on file  Social History Narrative  . Not on file   Social Determinants of Health   Financial Resource Strain:   . Difficulty of Paying Living Expenses: Not on file  Food Insecurity:   . Worried About Programme researcher, broadcasting/film/video in the Last Year: Not on file  . Ran Out of Food in the Last Year: Not on file  Transportation Needs:   . Lack of Transportation  (Medical): Not on file  . Lack of Transportation (Non-Medical): Not on file  Physical Activity:   . Days of Exercise per Week: Not on file  . Minutes of Exercise per Session: Not on file  Stress:   . Feeling of Stress : Not on file  Social Connections:   . Frequency of Communication with Friends and Family: Not on file  . Frequency of Social Gatherings with Friends and Family: Not on file  . Attends Religious Services: Not on file  . Active Member of Clubs or Organizations: Not on file  . Attends Banker Meetings: Not on file  . Marital Status: Not on file  Intimate Partner Violence:   . Fear of Current or Ex-Partner: Not on file  . Emotionally Abused: Not on file  . Physically Abused: Not on file  . Sexually Abused: Not on file    Outpatient Medications Prior to Visit  Medication Sig Dispense Refill  . bismuth-metronidazole-tetracycline (PYLERA) 140-125-125 MG capsule Take 3 capsules by mouth 4 (four) times daily -  before meals and at bedtime. (Patient not taking: Reported on 08/25/2020) 120 capsule 0  . ibuprofen (ADVIL,MOTRIN) 600 MG tablet Take 1 tablet (600 mg total) by mouth every 8 (eight) hours as needed for moderate pain. (Patient not taking: Reported on 08/25/2020) 30 tablet 0  . omeprazole (PRILOSEC) 40 MG capsule Take 1 capsule (40 mg total) by mouth 2 (two) times daily. To decrease stomach acid (Patient not taking: Reported on 08/25/2020) 60 capsule 3   No facility-administered medications prior to visit.    Not on File  ROS Review of Systems  Constitutional: Negative for chills and fever.  HENT: Negative for sore throat and trouble swallowing.   Respiratory: Negative for cough, shortness of breath and wheezing.   Cardiovascular: Negative for chest pain and palpitations.  Gastrointestinal: Negative for abdominal pain, constipation, diarrhea and nausea.  Endocrine: Negative for polydipsia, polyphagia and polyuria.  Genitourinary: Negative for dysuria  and frequency.  Musculoskeletal: Negative for arthralgias and back pain.  Neurological: Negative for dizziness and headaches.  Hematological: Negative for adenopathy. Does not bruise/bleed easily.      Objective:    Physical Exam Vitals and nursing note reviewed.  Constitutional:      Appearance: Normal appearance.  HENT:     Nose: No congestion or rhinorrhea.     Mouth/Throat:     Mouth: Mucous membranes are moist.     Pharynx: Oropharynx is clear. No oropharyngeal exudate or posterior oropharyngeal erythema.  Eyes:     Extraocular Movements: Extraocular movements intact.  Conjunctiva/sclera: Conjunctivae normal.  Cardiovascular:     Rate and Rhythm: Normal rate and regular rhythm.  Pulmonary:     Effort: Pulmonary effort is normal.     Breath sounds: Normal breath sounds.  Abdominal:     Tenderness: There is no abdominal tenderness. There is no right CVA tenderness, left CVA tenderness, guarding or rebound.  Musculoskeletal:     Cervical back: Normal range of motion and neck supple. No tenderness.     Right lower leg: No edema.     Left lower leg: No edema.  Lymphadenopathy:     Cervical: No cervical adenopathy.  Skin:    General: Skin is warm and dry.     Findings: No rash.  Neurological:     General: No focal deficit present.     Mental Status: She is alert and oriented to person, place, and time.  Psychiatric:        Mood and Affect: Mood normal.        Behavior: Behavior normal.     BP 106/72 (BP Location: Left Arm, Patient Position: Sitting)   Pulse 84   Temp 98.2 F (36.8 C)   Ht 5\' 4"  (1.626 m)   Wt 144 lb 12.8 oz (65.7 kg)   SpO2 98%   BMI 24.85 kg/m  Wt Readings from Last 3 Encounters:  08/25/20 144 lb 12.8 oz (65.7 kg)  07/30/20 140 lb (63.5 kg)  02/16/20 144 lb (65.3 kg)     Health Maintenance Due  Topic Date Due  . Hepatitis C Screening  Never done  . TETANUS/TDAP  Never done  . INFLUENZA VACCINE  06/11/2020      Lab Results    Component Value Date   TSH 2.310 08/19/2018   Lab Results  Component Value Date   WBC 6.7 02/03/2020   HGB 12.7 02/03/2020   HCT 40.0 02/03/2020   MCV 91 02/03/2020   PLT 198 02/03/2020   Lab Results  Component Value Date   NA 139 02/03/2020   K 4.3 02/03/2020   CO2 22 02/03/2020   GLUCOSE 86 02/03/2020   BUN 10 02/03/2020   CREATININE 0.70 02/03/2020   BILITOT <0.2 02/03/2020   ALKPHOS 36 (L) 02/03/2020   AST 18 02/03/2020   ALT 19 02/03/2020   PROT 6.9 02/03/2020   ALBUMIN 4.2 02/03/2020   CALCIUM 9.3 02/03/2020   No results found for: CHOL No results found for: HDL No results found for: LDLCALC No results found for: TRIG No results found for: [facility] Lab Results  Component Value Date   HGBA1C 5.2 08/19/2018      Assessment & Plan:  1. Seafood allergy Information given in Spanish regarding seafood allergy.  Patient is to avoid eating seafood of any kind.  She was provided with printed prescription and prescription was also transmitted electronically to the pharmacy of patient's choice for EpiPen.  She is also encouraged to have Benadryl-liquid or children's chewable, at home as well as in her purse in case of future allergic reaction.  She will also be referred to an allergist for further evaluation and treatment.  Seafood/shellfish allergy added to chart. - Ambulatory referral to Allergy  2.  Language barrier Video interpretation system was used to help with language barrier  Meds ordered this encounter  Medications  . DISCONTD: EPINEPHrine (EPIPEN 2-PAK) 0.3 mg/0.3 mL IJ SOAJ injection    Sig: Inject 0.3 mg into the muscle as needed for anaphylaxis. Severe allergic reaction    Dispense:  1 each    Refill:  11    Instructions in Spanish; give coupon if available  . EPINEPHrine (EPIPEN 2-PAK) 0.3 mg/0.3 mL IJ SOAJ injection    Sig: Inject 0.3 mg into the muscle as needed for anaphylaxis. Severe allergic reaction    Dispense:  1 each    Refill:  11     Instructions in Spanish; give coupon if available     Follow-up: Return for as needed and schedule annual well exam at your convenience.     Cain Saupeammie Neetu Carrozza, MD

## 2020-08-25 NOTE — Patient Instructions (Signed)
Alergia a los frutos de mar Seafood Allergy Una alergia a los frutos de mar es una reaccin anormal del sistema de defensa del organismo (sistema inmunitario) al pescado o a los mariscos. Si usted es Air cabin crew a los frutos de mar, al consumir pescado o mariscos su cuerpo Firefighter como si estos fueran una sustancia peligrosa. En algunos casos, la alergia a los frutos de mar puede causar una reaccin grave y potencialmente mortal que dificulta la respiracin (anafilaxia). La alergia a los mariscos es una de las ms frecuentes. Los mariscos incluyen el cangrejo, la [place] y los camarones. A menudo, la alergia a los mariscos no comienza [employer] [facility] [place]. Es menos frecuente tener una alergia a los pescados con [place], como el atn, el halibut o el salmn. A menudo, la alergia al pescado no comienza [employer]. Cules son las causas? La alergia a los frutos de mar ocurre cuando el sistema inmunitario considera que un pescado o marisco es daino y Administrator sustancias qumicas (anticuerpos) para combatirlo. Cules son los signos o los sntomas? Los sntomas de esta afeccin incluyen los siguientes:  Picazn u hormigueo en la boca.  Tos.  Congestin nasal.  Estornudos.  Nuseas y vmitos.  Diarrea.  Dolores de [place]. En las personas que sufren una [place] grave, puede producirse una reaccin potencialmente mortal denominada anafilaxia. Solicite ayuda de inmediato si tiene sntomas de [place], como los siguientes:  Sensacin de calor en la cara (rubor). Puede presentarse acompaada de enrojecimiento.  Zonas de piel hinchadas, rojas y que producen picazn (ronchas).  Hinchazn de los ojos, los labios, la cara, la [place], la boca o Patent examiner.  Dificultad para respirar, para hablar o para tragar.  Respiracin ruidosa (sibilancias).  Mareos o sensacin de desvanecimiento.  Desmayos.  Dolor o clicos en el abdomen. Cmo se diagnostica? Esta afeccin se puede  diagnosticar en funcin de lo siguiente:  Un examen fsico.  Sus antecedentes mdicos.  Anlisis de Organ.  Prueba por puncin epidrmica. Para esta prueba, se coloca en el brazo una pequea cantidad de un lquido que contiene una sustancia causante de la Buyer, retail.  Una prueba de tolerancia a los alimentos. Esta prueba implica comer los alimentos que pueden estar causando la respuesta alrgica mientras el mdico lo controla para Psychologist, clinical reaccin. Cmo se trata?  No hay cura para la alergia a los frutos de mar. El tratamiento se centra en evitar la exposicin al pescado o los mariscos que le causan alergia y tratar las reacciones si usted se ve expuesto al alimento en cuestin. Es posible que los sntomas leves no requieran tratamiento. Generalmente, las reacciones graves deben tratarse en un hospital. El tratamiento puede incluir lo siguiente:  Medicamentos para lo siguiente: ? Contraer los vasos sanguneos (epinefrina). ? Aliviar la picazn y la urticaria (antihistamnicos). ? Ensanchar las vas respiratorias estrechas y tensas (broncodilatadores). ? Reducir la hinchazn (corticoesteroides).  Oxigenoterapia para ayudarlo a que respire.  Lquidos intravenosos (i.v.) para Education officer, museum. Despus de [place] reaccin grave, es posible que le den medicamentos de rescate, como los siguientes:  Un kit de [place].  Una inyeccin de epinefrina, comnmente denominada "lpiz" autoinyector (dispositivo automtico precargado para inyectar epinefrina). El mdico puede ensearle a usar estos productos si usted por accidente queda expuesto a un alrgeno. Siga estas indicaciones en su casa: Comida y bebida  No coma el pescado o los mariscos que le causen Buyer, retail.  Lea con cuidado las etiquetas de los alimentos. El pescado y [place]  ser ingredientes de salsas, caldos y otros productos.  Si sale a Counselling psychologist, avsele al mozo que es alrgico a los frutos de mar.  Pregunte cmo se preparan los alimentos. Instrucciones generales  [employer] de venta libre y los recetados solamente como se lo haya indicado el mdico.  Use un brazalete o un collar de alerta mdica que describa su alergia.  Lleve consigo su kit de anafilaxia o un lpiz autoinyector en todo momento. selos como se lo haya indicado el mdico.  Asegrese de que usted, sus familiares y su empleador sepan lo siguiente: ? Los signos de la anafilaxia. ? Cmo usar un kit de [place]. ? Cmo usar un Civil engineer, contracting.  Si piensa que tiene Chemical engineer, use el Civil engineer, contracting o un kit de [place].  Reponga de inmediato su lpiz autoinyector despus de usarlo, en caso de tener otra reaccin.  Obtenga atencin mdica despus de usar el Civil engineer, contracting. Esto es importante porque puede tener una reaccin potencialmente mortal demorada despus de la administracin del medicamento (anafilaxia de rebote).  Informe a todos los mdicos que tiene alergia a los frutos de Oceanographer.  Concurra a todas las visitas de control como se lo haya indicado el mdico. Esto es importante. Comunquese con un mdico si:  Tiene sntomas que no desaparecen en el trmino de 2das.  Tiene sntomas que empeoran.  Tiene nuevos sntomas. Obtenga ayuda de inmediato si tiene sntomas de anafilaxia:  [place].  [place]chas.  Hinchazn de los ojos, los labios, la cara, la [place], la boca o Patent examiner.  Dificultad para respirar, para hablar o para tragar.  Sibilancias.  Mareos o sensacin de desvanecimiento.  Desmayos.  Dolor o clicos en el abdomen. Estos sntomas pueden representar un problema grave que constituye Engineer, maintenance (IT). No espere hasta que los sntomas desaparezcan. Use el lpiz autoinyector o el kit de anafilaxia como se lo hayan indicado. Solicite atencin mdica de inmediato. Comunquese con el servicio de emergencias de su localidad (911 en los [place]). No  conduzca por sus propios medios [employer] hospital. Si tuvo que usar un lpiz autoinyector, necesita ms atencin mdica incluso si el medicamento parece surtir [employer]. Esto es importante ya que la anafilaxia puede suceder otra vez dentro de las 72 horas. Resumen  La alergia a los frutos de mar ocurre cuando su cuerpo reacciona al consumir pescado o mariscos como si estos fueran una sustancia peligrosa.  En algunos casos, la alergia al pescado o a los mariscos puede causar una reaccin grave y potencialmente mortal que dificulte la respiracin (anafilaxia).  No hay cura para las Company secretary. El tratamiento se centra en evitar la exposicin al alimento o a los alimentos que le causan alergia y tratar las reacciones si usted se ve expuesto al alimento en cuestin.  Use un brazalete o un collar de alerta mdica que describa su alergia.  Asegrese de entender su plan de tratamiento de emergencia y sepa cmo utilizar el lpiz autoinyector. Esta informacin no tiene Marine scientist el consejo del mdico. Asegrese de hacerle al mdico cualquier pregunta que tenga. Document Revised: 01/07/2018 Document Reviewed: 01/07/2018 Elsevier Patient Education  2020 [employer].

## 2020-08-25 NOTE — Progress Notes (Signed)
Has a seafood allergy

## 2020-09-11 IMAGING — US US PELVIS COMPLETE WITH TRANSVAGINAL
1 series · 14 of 25 positions shown · non-contrast
Comparison: None

CLINICAL DATA: RIGHT adnexal tenderness/pain on bimanual exam



[Series 1: us pelvis complete with transvaginal · 0.19mm/px · 14 of 71 slices shown]
[im 1/71]
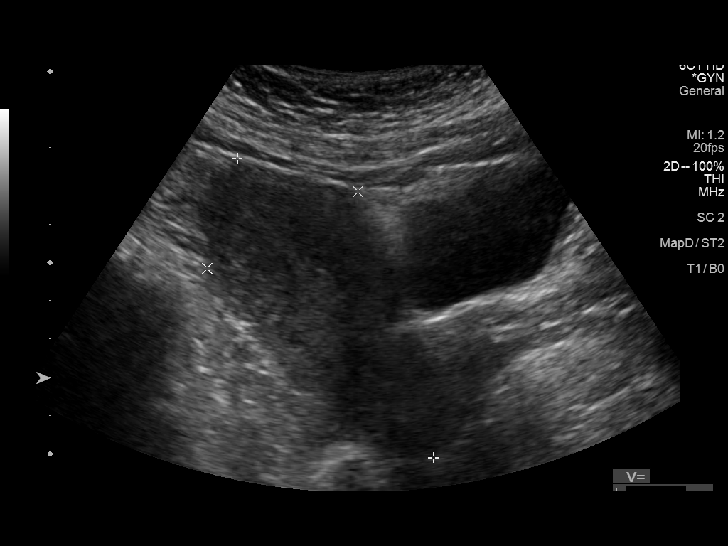
[im 6/71]
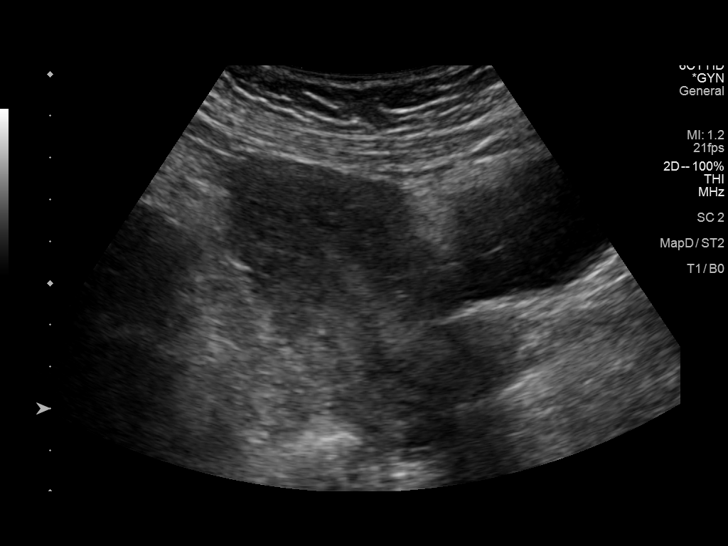
[im 12/71]
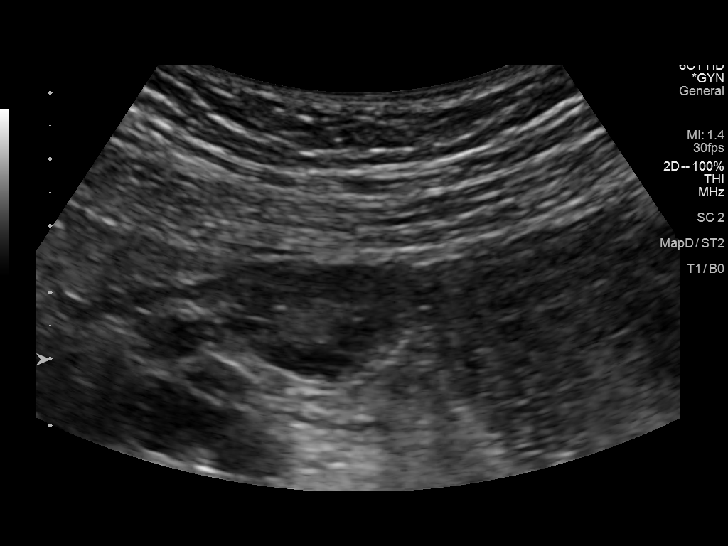
[im 18/71]
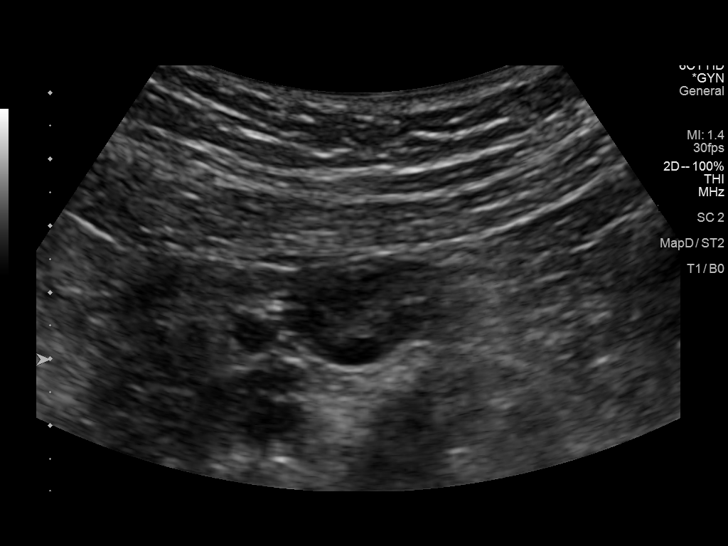
[im 24/71]
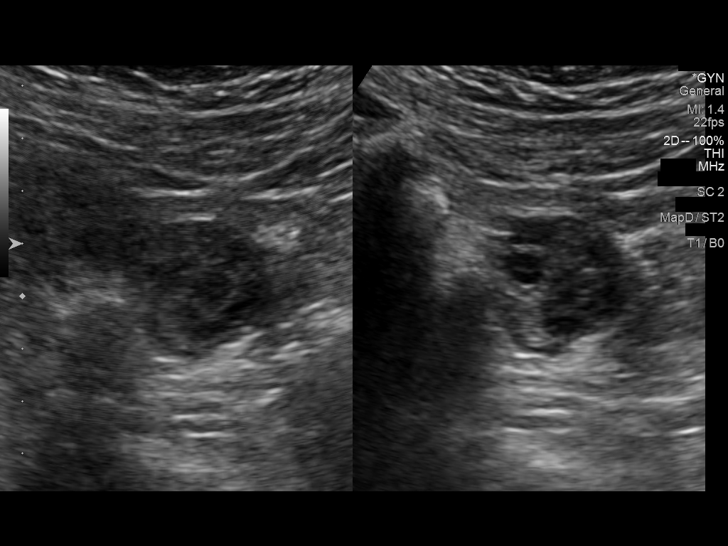
[im 27/71]
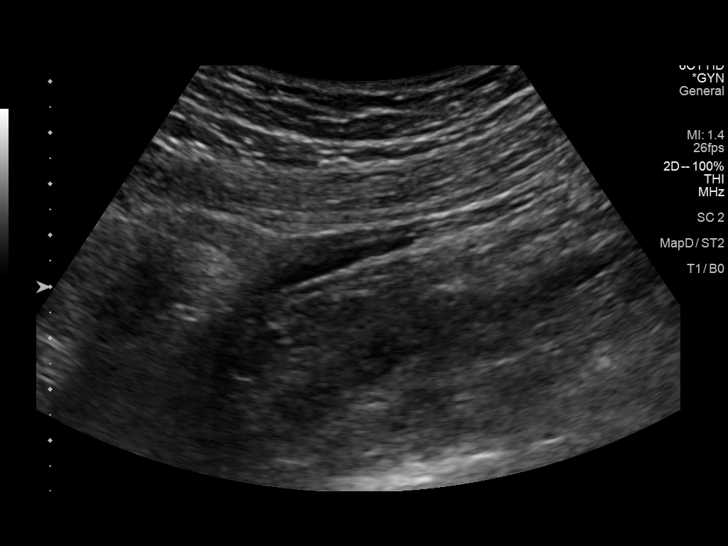
[im 33/71]
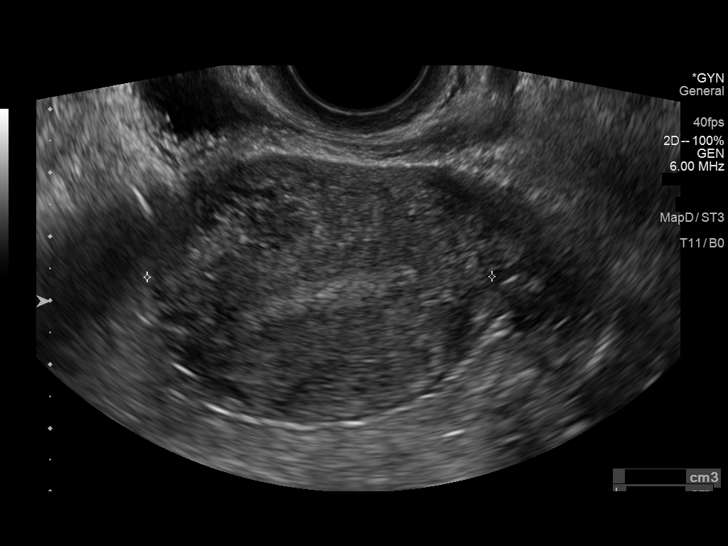
[im 38/71]
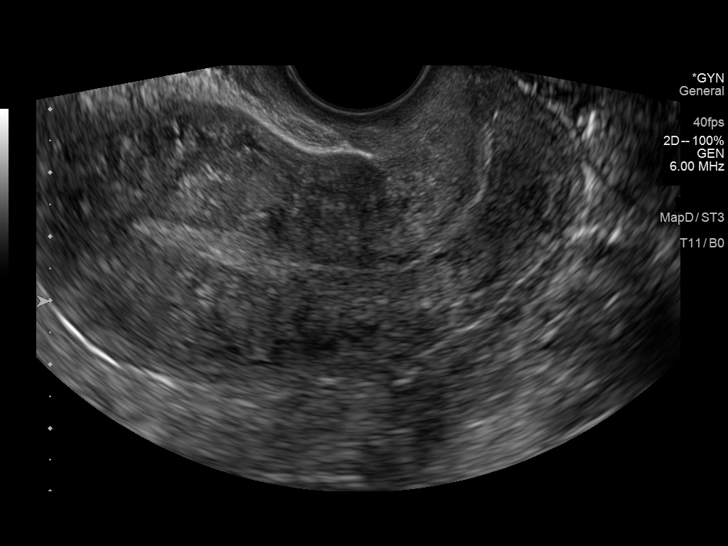
[im 44/71]
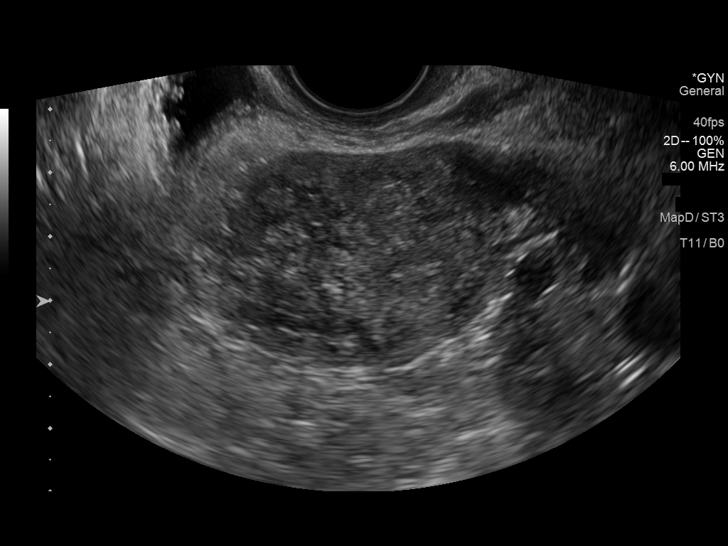
[im 47/71]
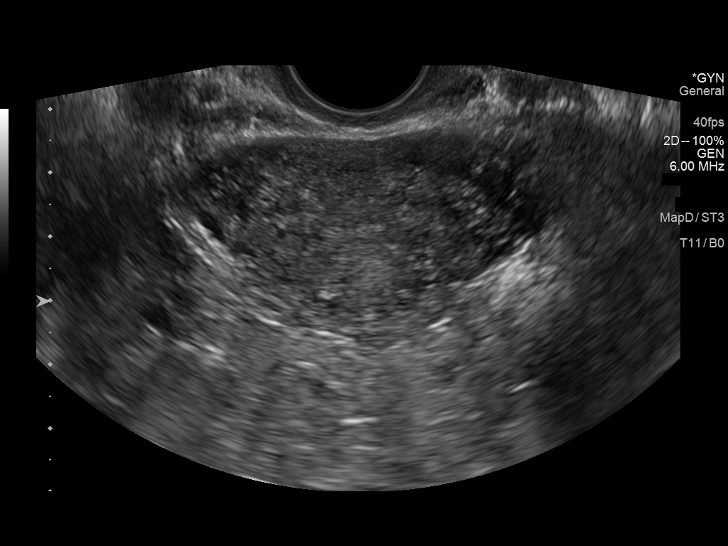
[im 53/71]
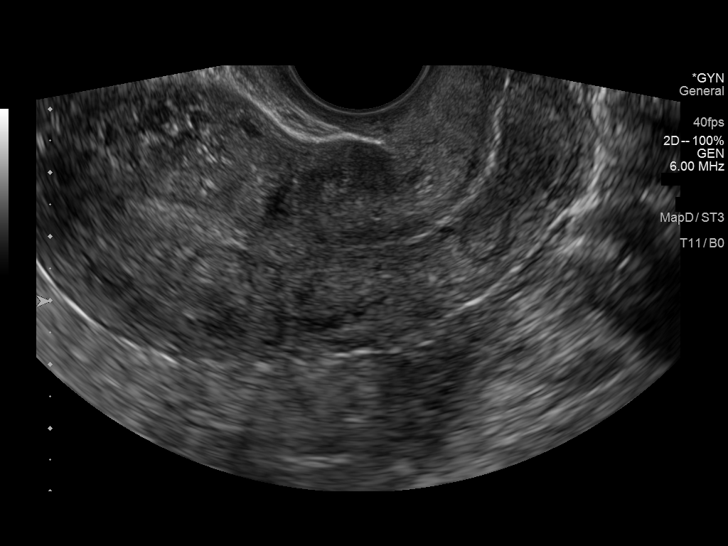
[im 59/71]
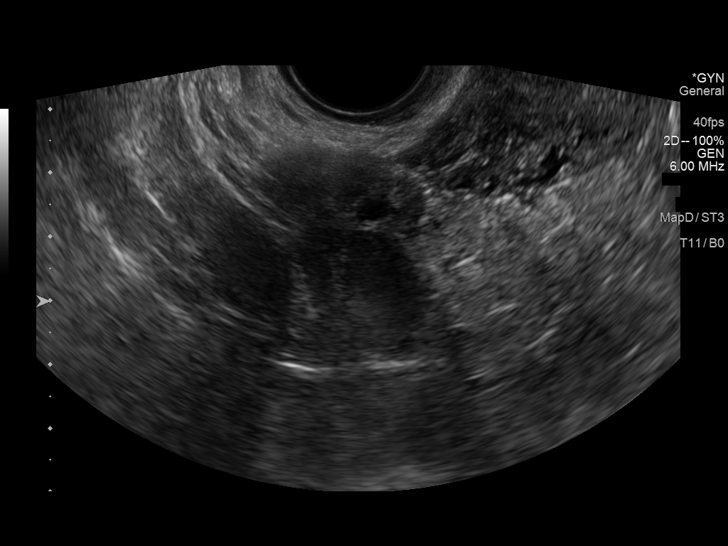
[im 65/71]
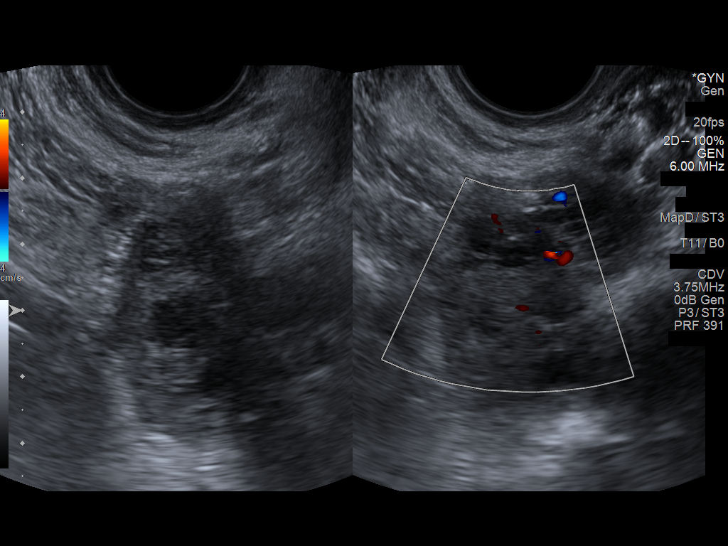
[im 71/71]
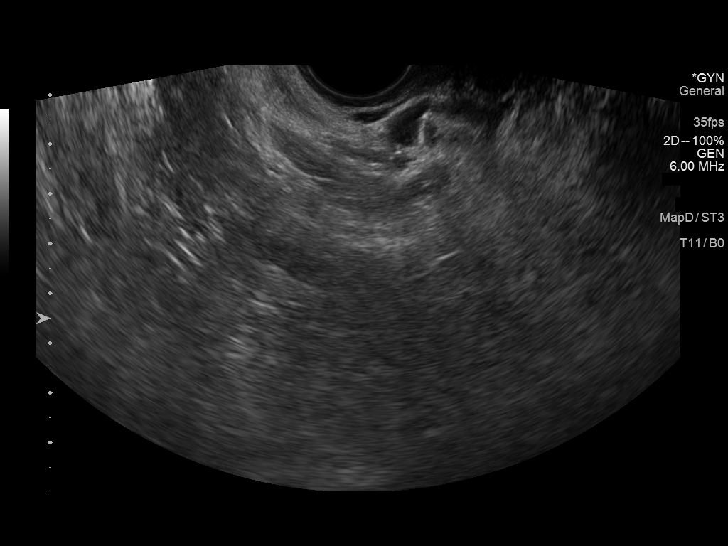

[14 of 25 positions shown; findings below may reference images not displayed]

FINDINGS: Uterus

Measurements: 9.4 x 4.4 x 6.0 cm = volume: 131 mL. Anteverted.
Nabothian cysts at cervix. Heterogeneous myometrium. No discrete
uterine mass.

Endometrium

Thickness: 4 mm.  No endometrial fluid or focal abnormality.

Right ovary

Measurements: 2.2 x 2.5 x 3.4 cm = volume: 10 mL. Normal morphology
without mass

Left ovary

Measurements: 2.3 x 2.8 x 2.2 cm = volume: 7.4 mL. Normal morphology
without mass

Other findings

No free pelvic fluid or adnexal masses.
IMPRESSION: Normal exam.

## 2020-10-12 ENCOUNTER — Encounter (HOSPITAL_COMMUNITY): Payer: Self-pay | Admitting: Emergency Medicine

## 2020-10-12 ENCOUNTER — Other Ambulatory Visit: Payer: Self-pay

## 2020-10-12 ENCOUNTER — Ambulatory Visit (HOSPITAL_COMMUNITY)
Admission: EM | Admit: 2020-10-12 | Discharge: 2020-10-12 | Disposition: A | Discharge status: Home / Self Care | Payer: Medicaid Other | Attending: Internal Medicine | Admitting: Internal Medicine

## 2020-10-12 DIAGNOSIS — S93602A Unspecified sprain of left foot, initial encounter: Secondary | ICD-10-CM | POA: Diagnosis not present

## 2020-10-12 MED ORDER — IBUPROFEN 600 MG PO TABS
600.0000 mg | ORAL_TABLET | Freq: Four times a day (QID) | ORAL | 0 refills | Status: DC | PRN
Start: 2020-10-12 — End: 2022-07-02

## 2020-10-12 NOTE — ED Provider Notes (Signed)
[facility]    CSN: [id number] Arrival date & time: 10/12/20  1146      History   Chief Complaint Chief Complaint  Patient presents with  . Foot Pain    HPI Rebecca Nash is a 29 y.o. female comes to the urgent care with complains of moderately severe pain in the left foot of 2 weeks duration. Patient says the symptoms started insidiously and has been persistent. Pain is sharp, constant, aggravated by walking and not relieved by tylenol.Pain radiates up the left leg.No trauma or changes in shoes. Patient says that her shoes are appropriately sized and comfortable.  No numbness or tingling.   Patient's job involves a lot of walking.  She basically walks up and down the whole day.  She works 5 days a week  HPI  Past Medical History:  Diagnosis Date  . Depression    was on meds prior to moving here post partum  . Late prenatal care     Patient Active Problem List   Diagnosis Date Noted  . History of shoulder dystocia in prior pregnancy 01/30/2018  . Thrombocytopenia (HCC) 01/28/2018  . Family history of congenital anomalies 08/10/2013  . Family history of genetic disorder 08/10/2013    Past Surgical History:  Procedure Laterality Date  . TUBAL LIGATION Bilateral 01/28/2018   Procedure: POST PARTUM TUBAL LIGATION;  Surgeon: Kathrynn Running, MD;  Location: [facility];  Service: Gynecology;  Laterality: Bilateral;    OB History    Gravida  3   Para  3   Term  3   Preterm  0   AB  0   Living  3     SAB  0   TAB  0   Ectopic  0   Multiple  0   Live Births  3            Home Medications    Prior to Admission medications   Medication Sig Start Date End Date Taking? Authorizing Provider  EPINEPHrine (EPIPEN 2-PAK) 0.3 mg/0.3 mL IJ SOAJ injection Inject 0.3 mg into the muscle as needed for anaphylaxis. Severe allergic reaction 08/25/20   Fulp, Cammie, MD  ibuprofen (ADVIL) 600 MG tablet Take 1 tablet (600 mg total) by  mouth every 6 (six) hours as needed. 10/12/20   LampteyBritta Mccreedy, MD    Family History Family History  Problem Relation Age of Onset  . Other Other        Moebius syndrome  . Hypertension Mother   . Colon cancer Mother   . Cervical cancer Mother   . Diabetes Mother   . Hyperlipidemia Mother   . Anemia Daughter        blood transfusion at 40mos  . Vision loss Daughter        eye surgery  . Other Daughter        Sotos syndrome  . Hypertension Father   . Diabetes Father     Social History Social History   Tobacco Use  . Smoking status: Never Smoker  . Smokeless tobacco: Never Used  Vaping Use  . Vaping Use: Never used  Substance Use Topics  . Alcohol use: No  . Drug use: No     Allergies   S[place]ops [shellfish allergy]   Review of Systems Review of Systems  Constitutional: Negative.   Musculoskeletal: Negative.  Negative for back pain, gait problem and joint swelling.  Skin: Negative.   Neurological: Negative.  Physical Exam Triage Vital Signs ED Triage Vitals  Enc Vitals Group     BP 10/12/20 1330 98/60     Pulse Rate 10/12/20 1330 74     Resp --      Temp 10/12/20 1330 98.1 F (36.7 C)     Temp Source 10/12/20 1330 Oral     SpO2 10/12/20 1330 100 %     Weight --      Height --      Head Circumference --      Peak Flow --      Pain Score 10/12/20 1328 10     Pain Loc --      Pain Edu? --      Excl. in GC? --    No data found.  Updated Vital Signs BP 98/60 (BP Location: Right Arm)   Pulse 74   Temp 98.1 F (36.7 C) (Oral)   LMP 09/28/2020   SpO2 100%   Visual Acuity Right Eye Distance:   Left Eye Distance:   Bilateral Distance:    Right Eye Near:   Left Eye Near:    Bilateral Near:     Physical Exam Vitals and nursing note reviewed.  Constitutional:      General: She is not in acute distress.    Appearance: Normal appearance. She is not ill-appearing.  Musculoskeletal:        General: Swelling and tenderness present. No  deformity. Normal range of motion.     Comments: Tenderness on palpation over the medial aspect of the plantar surface of the left foot.  Skin:    General: Skin is warm and dry.     Capillary Refill: Capillary refill takes less than 2 seconds.  Neurological:     General: No focal deficit present.     Mental Status: She is alert and oriented to person, place, and time.      UC Treatments / Results  Labs (all labs ordered are listed, but only abnormal results are displayed) Labs Reviewed - No data to display  EKG   Radiology No results found.  Procedures Procedures (including critical care time)  Medications Ordered in UC Medications - No data to display  Initial Impression / Assessment and Plan / UC Course  I have reviewed the triage vital signs and the nursing notes.  Pertinent labs & imaging results that were available during my care of the patient were reviewed by me and considered in my medical decision making (see chart for details).    1.  Left foot sprain: Ibuprofen 600 mg as needed for pain Icing of the left foot Elevation after he returned from work Patient advised to get shoe inserts to help cushion the inner foot arch. Return precautions given Final Clinical Impressions(s) / UC Diagnoses   Final diagnoses:  Foot sprain, left, initial encounter     Discharge Instructions     Please ice your foot after you get home from work Elevate and rest your feet after you get home from work Please get an insert in your shoes to help cushion your feet Please return if your symptoms worsen.   ED Prescriptions    Medication Sig Dispense Auth. Provider   ibuprofen (ADVIL) 600 MG tablet Take 1 tablet (600 mg total) by mouth every 6 (six) hours as needed. 30 tablet Amarianna Abplanalp, Britta Mccreedy, MD     PDMP not reviewed this encounter.   Merrilee Jansky, MD 10/12/20 [phone]

## 2020-10-12 NOTE — Discharge Instructions (Addendum)
Please ice your foot after you get home from work Elevate and rest your feet after you get home from work Please get an insert in your shoes to help cushion your feet Please return if your symptoms worsen.

## 2020-10-12 NOTE — ED Triage Notes (Signed)
Pt c/o left foot pain x 2 weeks. Pt states pain is radiating up her leg. She has been using ice and tylenol with no relief.

## 2020-10-24 NOTE — Progress Notes (Signed)
New Patient Note  RE: Rebecca Nash MRN: [id number] DOB: 01-05-1991 Date of Office Visit: 10/25/2020  Referring provider: Cain Saupe, MD Primary care provider: Cain Saupe, MD  Chief Complaint: Allergic Reaction (Scallops/shellfish)  History of Present Illness: I had the pleasure of seeing Rebecca Nash for initial evaluation at the [facility] on 10/25/2020. She is a 29 y.o. female, who is referred here by Cain Saupe, MD for the evaluation of seafood allergy. Spanish interpreter present in person.   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 The reaction occurred a few months ago, after she ate one small scallop at a restaurant. Symptoms started within 2 hours at home and was in the form of trouble breathing, nausea, feeling faint, flushed, nasal congestion and shaking.  This was the first time she had scallops. They do eat at this restaurant frequently.   Denies any associated cofactors such as exertion, infection, NSAID use. The symptoms lasted for about 1 day after taking children's zyrtec. She went to the ER but left before being evaluated as she was feeling better. Since this episode, she does not report other accidental exposures to scallops. She does have access to epinephrine autoinjector and not needed to use it.   She has been avoiding scallops and all seafood after this incident. Now noticing some rash/nausea after eating shrimp as well. No issues with finned fish before.    Past work up includes: none. Dietary History: patient has been eating other foods including milk, eggs, peanut, treenuts, sesame, wheat, meats, fruits and vegetables.  08/25/2020 PCP visit: "Rebecca Nash, 29 year old female, who is status post emergency department visit on 07/30/2020 due to an allergic reaction.  She reports that she actually left the emergency department after being there for more than 5 hours without being called back to be seen.   She reports that on 07/29/2020 she and her husband went out to dinner and had seafood.  Patient reports that she had scallops which she had never eaten in the past.  After returning home and getting ready for bed then lying down, she began to have the sensation of itchy mouth/itchy throat followed by shortness of breath and feeling as if she was having difficulty breathing and having swelling in her throat/tongue and when she looked in the mirror in her bathroom she was having generalized swelling.  She reports that she was also feeling very anxious and her hands were shaking but she was able to drink some liquids Zyrtec that was prescribed for her daughter who is allergic to cherries. patient states that she did not know what dose she should take since this was a children's medication and she drank about half of the bottle but did spill some of the medication because her hands were shaking.           She had her husband drive her to the emergency department however her swelling and sensation of shortness of breath and difficulty swallowing due to sensation of swelling in her tongue and throat had improved.  After being in the emergency department for more than 5 hours, patient left.  She denies any prior allergic type reactions to any foods, insect stings or other substances.  She believes that when she was very young, around [age], she may have had some type of reaction but of course does not recall exactly what occurred.  She did have fish for dinner this past Sunday and started having sensation of  itching in her mouth and throat and took Benadryl which she had purchased after her ED visit and her symptoms went away."  Assessment and Plan: Rebecca Nash is a 29 y.o. female with: Adverse food reaction Reaction to scallops in the form of trouble breathing, nausea, flushing, nasal congestion, shaking. Resolved with liquid zyrtec. Now having issues with shrimp - rash and nausea. No prior issues with finned fish.    Today's skin testing showed: positive to shellfish, shrimp, lobster, oyster. Negative to fish.  Okay to eat finned fish - did stress importance of avoiding cross-contamination especially at restaurants.   Continue to avoid all shellfish (shrimp, crab, lobster) and mollusks such as oyster and scallops.  For mild symptoms you can take over the counter antihistamines such as Benadryl and monitor symptoms closely. If symptoms worsen or if you have severe symptoms including breathing issues, throat closure, significant swelling, whole body hives, severe diarrhea and vomiting, lightheadedness then inject epinephrine and seek immediate medical care afterwards.  Reviewed epinephrine use in Spanish.   Food action plan given in Spanish.   Will repeat skin testing and/or bloodwork in 1 year.    Return in about 1 year (around 10/25/2021).  Other allergy screening: Asthma: no Rhino conjunctivitis: no Medication allergy: no Hymenoptera allergy: no Urticaria: no Eczema:no History of recurrent infections suggestive of immunodeficency: no  Diagnostics: Skin Testing: Select foods. Today's skin testing showed: positive to shellfish, shrimp, lobster, oyster. Negative to fish. Results discussed with patient/family.  Food Adult Perc - 10/25/20 1500    Time Antigen Placed 1502    Allergen Manufacturer Waynette ButteryGreer    Location Arm     Control-buffer 50% Glycerol Negative    Control-Histamine 1 mg/ml 2+    8. Shellfish Mix --   5x4   9. Fish Mix Negative    18. Catfish Negative    19. Bass Negative    20. Trout Negative    21. Tuna Negative    22. Salmon Negative    23. Flounder Negative    24. Codfish Negative    25. Shrimp --   4x4   26. Crab Negative    27. Lobster --   3x3   28. Oyster --   3x3   29. Scallops Negative           Past Medical History: Patient Active Problem List   Diagnosis Date Noted  . Adverse food reaction 10/25/2020  . History of shoulder dystocia in prior  pregnancy 01/30/2018  . Thrombocytopenia (HCC) 01/28/2018  . Family history of congenital anomalies 08/10/2013  . Family history of genetic disorder 08/10/2013   Past Medical History:  Diagnosis Date  . Depression    was on meds prior to moving here post partum  . Late prenatal care   . Urticaria    Past Surgical History: Past Surgical History:  Procedure Laterality Date  . TUBAL LIGATION Bilateral 01/28/2018   Procedure: POST PARTUM TUBAL LIGATION;  Surgeon: Kathrynn RunningWouk, Noah Bedford, MD;  [facility];  Service: Gynecology;  Laterality: Bilateral;   Medication List:  Current Outpatient Medications  Medication Sig Dispense Refill  . EPINEPHrine (EPIPEN 2-PAK) 0.3 mg/0.3 mL IJ SOAJ injection Inject 0.3 mg into the muscle as needed for anaphylaxis. Severe allergic reaction 1 each 11  . ibuprofen (ADVIL) 600 MG tablet Take 1 tablet (600 mg total) by mouth every 6 (six) hours as needed. 30 tablet 0   No current facility-administered medications for this visit.   Allergies: Allergies  Allergen Reactions  . Scallops [Shellfish Allergy] Swelling   Social History: Social History   Socioeconomic History  . Marital status: Married    Spouse name: Not on file  . Number of children: Not on file  . Years of education: Not on file  . Highest education level: Not on file  Occupational History  . Not on file  Tobacco Use  . Smoking status: Never Smoker  . Smokeless tobacco: Never Used  Vaping Use  . Vaping Use: Never used  Substance and Sexual Activity  . Alcohol use: No  . Drug use: No  . Sexual activity: Yes    Birth control/protection: None  Other Topics Concern  . Not on file  Social History Narrative  . Not on file   Social Determinants of Health   Financial Resource Strain: Not on file  Food Insecurity: Not on file  Transportation Needs: Not on file  Physical Activity: Not on file  Stress: Not on file  Social Connections: Not on file   Lives in a house  which is 28 years old. Smoking: denies Occupation: cleaning and maintenance  Environmental History: Water Damage/mildew in the house: no Carpet in the family room: no Carpet in the bedroom: yes Heating: gas Cooling: central Pet: no  Family History: Family History  Problem Relation Age of Onset  . Other Other        Moebius syndrome  . Hypertension Mother   . Colon cancer Mother   . Cervical cancer Mother   . Diabetes Mother   . Hyperlipidemia Mother   . Anemia Daughter        blood transfusion at 52mos  . Vision loss Daughter        eye surgery  . Other Daughter        Sotos syndrome  . Hypertension Father   . Diabetes Father   . Asthma Sister   . Allergic rhinitis Neg Hx   . Angioedema Neg Hx   . Atopy Neg Hx   . Eczema Neg Hx   . Immunodeficiency Neg Hx   . Urticaria Neg Hx    Review of Systems  Constitutional: Negative for appetite change, chills, fever and unexpected weight change.  HENT: Negative for congestion and rhinorrhea.   Eyes: Negative for itching.  Respiratory: Negative for cough, chest tightness, shortness of breath and wheezing.   Cardiovascular: Negative for chest pain.  Gastrointestinal: Negative for abdominal pain.  Genitourinary: Negative for difficulty urinating.  Skin: Negative for rash.  Allergic/Immunologic: Positive for food allergies.  Neurological: Negative for headaches.   Objective: BP 110/62   Pulse 81   Temp 97.8 F (36.6 C) (Temporal)   Resp 16   Ht 5\' 1"  (1.549 m)   Wt 144 lb 12.8 oz (65.7 kg)   LMP 09/28/2020   SpO2 97%   BMI 27.36 kg/m  Body mass index is 27.36 kg/m. Physical Exam Vitals and nursing note reviewed.  Constitutional:      Appearance: Normal appearance. She is well-developed.  HENT:     Head: Normocephalic and atraumatic.     Right Ear: External ear normal.     Left Ear: External ear normal.     Nose: Nose normal.     Mouth/Throat:     Mouth: Mucous membranes are moist.     Pharynx: Oropharynx is  clear.  Eyes:     Conjunctiva/sclera: Conjunctivae normal.  Cardiovascular:     Rate and Rhythm: Normal rate and regular rhythm.  Heart sounds: Normal heart sounds. No murmur heard. No friction rub. No gallop.   Pulmonary:     Effort: Pulmonary effort is normal.     Breath sounds: Normal breath sounds. No wheezing, rhonchi or rales.  Abdominal:     Palpations: Abdomen is soft.  Musculoskeletal:     Cervical back: Neck supple.  Skin:    General: Skin is warm.     Findings: No rash.  Neurological:     Mental Status: She is alert and oriented to person, place, and time.  Psychiatric:        Behavior: Behavior normal.    The plan was reviewed with the patient/family, and all questions/concerned were addressed.  It was my pleasure to see Rebecca Nash today and participate in her care. Please feel free to contact me with any questions or concerns.  Sincerely,  Wyline Mood, DO Allergy & Immunology  [facility] office: [phone] [facility] office: [phone]

## 2020-10-25 ENCOUNTER — Encounter: Payer: Self-pay | Admitting: Allergy

## 2020-10-25 ENCOUNTER — Other Ambulatory Visit: Payer: Self-pay

## 2020-10-25 ENCOUNTER — Ambulatory Visit (INDEPENDENT_AMBULATORY_CARE_PROVIDER_SITE_OTHER): Payer: Medicaid Other | Admitting: Allergy

## 2020-10-25 VITALS — BP 110/62 | HR 81 | Temp 97.8°F | Resp 16 | Ht 61.0 in | Wt 144.8 lb

## 2020-10-25 DIAGNOSIS — T781XXD Other adverse food reactions, not elsewhere classified, subsequent encounter: Secondary | ICD-10-CM

## 2020-10-25 DIAGNOSIS — T781XXA Other adverse food reactions, not elsewhere classified, initial encounter: Secondary | ICD-10-CM | POA: Insufficient documentation

## 2020-10-25 DIAGNOSIS — T7819XA Other adverse food reactions, not elsewhere classified, initial encounter: Secondary | ICD-10-CM | POA: Insufficient documentation

## 2020-10-25 NOTE — Assessment & Plan Note (Addendum)
Reaction to scallops in the form of trouble breathing, nausea, flushing, nasal congestion, shaking. Resolved with liquid zyrtec. Now having issues with shrimp - rash and nausea. No prior issues with finned fish.   Today's skin testing showed: positive to shellfish, shrimp, lobster, oyster. Negative to fish.  Okay to eat finned fish - did stress importance of avoiding cross-contamination especially at restaurants.   Continue to avoid all shellfish (shrimp, crab, lobster) and mollusks such as oyster and scallops.  For mild symptoms you can take over the counter antihistamines such as Benadryl and monitor symptoms closely. If symptoms worsen or if you have severe symptoms including breathing issues, throat closure, significant swelling, whole body hives, severe diarrhea and vomiting, lightheadedness then inject epinephrine and seek immediate medical care afterwards.  Reviewed epinephrine use in Spanish.   Food action plan given in Spanish.   Will repeat skin testing and/or bloodwork in 1 year.

## 2020-10-25 NOTE — Patient Instructions (Addendum)
Food:  Today's skin testing showed: positive to shellfish, shrimp, lobster, oyster. Negative to fish.   Okay to eat finned fish.   Continue to avoid all shellfish (shrimp, crab, lobster) and mollusks such as oyster and scallops.  For mild symptoms you can take over the counter antihistamines such as Benadryl and monitor symptoms closely. If symptoms worsen or if you have severe symptoms including breathing issues, throat closure, significant swelling, whole body hives, severe diarrhea and vomiting, lightheadedness then inject epinephrine and seek immediate medical care afterwards.  Food action plan given in Spanish.   Follow up in 1 year or sooner if needed.

## 2021-02-08 ENCOUNTER — Ambulatory Visit: Payer: Medicaid Other | Attending: Family Medicine

## 2021-02-08 ENCOUNTER — Other Ambulatory Visit: Payer: Self-pay

## 2021-02-08 DIAGNOSIS — Z23 Encounter for immunization: Secondary | ICD-10-CM

## 2021-02-13 ENCOUNTER — Other Ambulatory Visit: Payer: Self-pay | Admitting: Physician Assistant

## 2021-02-13 DIAGNOSIS — A048 Other specified bacterial intestinal infections: Secondary | ICD-10-CM

## 2021-07-30 ENCOUNTER — Ambulatory Visit: Payer: Self-pay | Admitting: *Deleted

## 2021-07-30 NOTE — Telephone Encounter (Signed)
Reason for Disposition  [1] SEVERE headache (e.g., excruciating) AND [2] not improved after 2 hours of pain medicine  Answer Assessment - Initial Assessment Questions 1. LOCATION: "Where does it hurt?"      Pt called in with Spanish interpreter.    I have a headache in the back of my head and neck going to the top of my head.   Has a history of migraines but it's been a long time since I've had one. I have a medication but it's not helping the headache. 2. ONSET: "When did the headache start?" (Minutes, hours or days)      Saturday around 4:00 PM.    I'm not [place] and I have nausea.   Started on Sat.   Not tested for Covid when asked since headache is a symptom of Covid. She denies nasal congestion, sore throat, coughing or fever. Denies injuries or accidents involving her head. 3. PATTERN: "Does the pain come and go, or has it been constant since it started?"     Constant.    I couldn't sleep last night. 4. SEVERITY: "How bad is the pain?" and "What does it keep you from doing?"  (e.g., Scale 1-10; mild, moderate, or severe)   - MILD (1-3): doesn't interfere with normal activities    - MODERATE (4-7): interferes with normal activities or awakens from sleep    - SEVERE (8-10): excruciating pain, unable to do any normal activities        9/10 on pain scale   I've tried Excedrin Migraine but it hasn't helped. 5. RECURRENT SYMPTOM: "Have you ever had headaches before?" If Yes, ask: "When was the last time?" and "What happened that time?"      Yes  Had migraines but it's been a long time since she has had one. 6. CAUSE: "What do you think is causing the headache?"     I don't know.   It started suddenly. 7. MIGRAINE: "Have you been diagnosed with migraine headaches?" If Yes, ask: "Is this headache similar?"      12 yrs ago I was diagnosed with migraines.   I don't think so.   This is different. 8. HEAD INJURY: "Has there been any recent injury to the head?"      Denies injuries or accidents  involving head. 9. OTHER SYMPTOMS: "Do you have any other symptoms?" (fever, stiff neck, eye pain, sore throat, cold symptoms)     See above no other symptoms. I have a strong backache in the middle of my back.   It goes along with the headache.  10. PREGNANCY: "Is there any chance you are pregnant?" "When was your last menstrual period?"       No I had surgery so not have more babies.  Protocols used: [facility]

## 2021-07-30 NOTE — Telephone Encounter (Signed)
Pt called into [facility] c/o a headache that started Saturday.   See triage notes  I have referred her to the urgent care or ED based on protocol to be seen within 4 hours.  There are no appts available within that timeframe at [facility].   She is agreeable to going to the urgent care near where she lives.  Spanish interpreter was Blossom Hoops #[id number].

## 2021-08-09 ENCOUNTER — Ambulatory Visit: Payer: Self-pay

## 2021-08-09 NOTE — Telephone Encounter (Signed)
Pt has not been seen since 2021 she has the first available appointment to discuss.

## 2021-08-09 NOTE — Telephone Encounter (Signed)
Pt has a bump on her chin /neck area that she has had for 1 week / pt stated it is sore / next available appt is 10.19.22/ please advise  Using Spanish interpreter, Rebecca Nash, # [phone]. Reports she has a boil at her neck and jaw line. Painful to touch. Requests to be seen as soon as possible. Please advise.   Reason for Disposition  [1] Boil AND [2] not improved > 3 days following CARE ADVICE  Answer Assessment - Initial Assessment Questions 1. APPEARANCE of BOIL: "What does the boil look like?"      Lump 2. LOCATION: "Where is the boil located?"      Jaw 3. NUMBER: "How many boils are there?"      1 4. SIZE: "How big is the boil?" (e.g., inches, cm; compare to size of a coin or other object)     Dime 5. ONSET: "When did the boil start?"     1 week ago 6. PAIN: "Is there any pain?" If Yes, ask: "How bad is the pain?"   (Scale 1-10; or mild, moderate, severe)     6 7. FEVER: "Do you have a fever?" If Yes, ask: "What is it, how was it measured, and when did it start?"      No 8. SOURCE: "Have you been around anyone with boils or other Staph infections?" "Have you ever had boils before?"     No 9. OTHER SYMPTOMS: "Do you have any other symptoms?" (e.g., shaking chills, weakness, rash elsewhere on body)     No 10. PREGNANCY: "Is there any chance you are pregnant?" "When was your last menstrual period?"       No  Protocols used: Boil (Skin Abscess)-A-AH

## 2021-08-29 ENCOUNTER — Encounter: Payer: Self-pay | Admitting: Physician Assistant

## 2021-08-29 ENCOUNTER — Ambulatory Visit: Payer: Medicaid Other | Attending: Physician Assistant | Admitting: Physician Assistant

## 2021-08-29 ENCOUNTER — Other Ambulatory Visit: Payer: Self-pay

## 2021-08-29 VITALS — BP 102/69 | HR 68 | Resp 16 | Wt 149.6 lb

## 2021-08-29 DIAGNOSIS — R590 Localized enlarged lymph nodes: Secondary | ICD-10-CM

## 2021-08-29 DIAGNOSIS — Z789 Other specified health status: Secondary | ICD-10-CM

## 2021-08-29 NOTE — Progress Notes (Signed)
Patient ID: Rebecca Nash, female   DOB: [birth year], 30 y.o.   MRN: [id number]   Rebecca Nash, is a 30 y.o. female  KXF:[id number]  [id number]  DOB - September 05, 1991  Chief Complaint  Patient presents with   Mass       Subjective:   Rebecca Nash is a 30 y.o. female here today for tender lump under her chin.  She first noticed it about 3 weeks ago.  She says it is no longer painful and has decreased in size.  She denies feer.  No recent ST or dental infection.  No difficulty swallowing.  Appetite has been good.  No weight loss or gain.    No problems updated.  ALLERGIES: Allergies  Allergen Reactions   Scallops [Shellfish Allergy] Swelling    PAST MEDICAL HISTORY: Past Medical History:  Diagnosis Date   Depression    was on meds prior to moving here post partum   Late prenatal care    Urticaria     MEDICATIONS AT HOME: Prior to Admission medications   Medication Sig Start Date End Date Taking? Authorizing Provider  EPINEPHrine (EPIPEN 2-PAK) 0.3 mg/0.3 mL IJ SOAJ injection Inject 0.3 mg into the muscle as needed for anaphylaxis. Severe allergic reaction 08/25/20   Fulp, Cammie, MD  ibuprofen (ADVIL) 600 MG tablet Take 1 tablet (600 mg total) by mouth every 6 (six) hours as needed. 10/12/20   Lamptey, Britta Mccreedy, MD    ROS: neg constituntional Neg HEENT Neg resp Neg cardiac Neg GI Neg GU Neg MS Neg psych Neg neuro  Objective:   Vitals:   08/29/21 1115  BP: 102/69  Pulse: 68  Resp: 16  SpO2: 99%  Weight: 149 lb 9.6 oz (67.9 kg)   Exam General appearance : Awake, alert, not in any distress. Speech Clear. Not toxic looking HEENT: Atraumatic and Normocephalic.  Throat WNL.  No erythema.  B ears=B.  No ludwigs angina.  No discrete LN or thyroid abnormality.  No erythema of skin Neck: Supple, no JVD. No cervical lymphadenopathy.  Chest: Good air entry bilaterally, CTAB.  No rales/rhonchi/wheezing CVS: S1 S2 regular, no murmurs.   Extremities: B/L Lower Ext shows no edema, both legs are warm to touch Neurology: Awake alert, and oriented X 3, CN II-XII intact, Non focal Skin: No Rash  Data Review Lab Results  Component Value Date   HGBA1C 5.2 08/19/2018    Assessment & Plan   1. Lymphadenopathy of head and neck region No discreet LN(she does say it has almost resolved since 3 weeks ago. - Ambulatory referral to ENT - Thyroid Panel With TSH - CBC with Differential/Platelet  2. Language barrier [employer] interpreters "Mikle Bosworth" used and additional time performing visit was required.   Patient have been counseled extensively about nutrition and exercise. Other issues discussed during this visit include: low cholesterol diet, weight control and daily exercise, foot care, annual eye examinations at Ophthalmology, importance of adherence with medications and regular follow-up. We also discussed long term complications of uncontrolled diabetes and hypertension.   Return in about 6 months (around 02/27/2022) for assign PCP-check up.  The patient was given clear instructions to go to ER or return to medical center if symptoms don't improve, worsen or new problems develop. The patient verbalized understanding. The patient was told to call to get lab results if they haven't heard anything in the next week.      Georgian Co, PA-C [facility] [place], [place]  [phone]   08/29/2021, 11:16 AM

## 2021-08-30 LAB — CBC WITH DIFFERENTIAL/PLATELET
Basophils Absolute: 0 10*3/uL (ref 0.0–0.2)
Basos: 0 %
EOS (ABSOLUTE): 0.2 10*3/uL (ref 0.0–0.4)
Eos: 3 %
Hematocrit: 37.6 % (ref 34.0–46.6)
Hemoglobin: 13.2 g/dL (ref 11.1–15.9)
Immature Grans (Abs): 0 10*3/uL (ref 0.0–0.1)
Immature Granulocytes: 0 %
Lymphocytes Absolute: 2.7 10*3/uL (ref 0.7–3.1)
Lymphs: 35 %
MCH: 30.3 pg (ref 26.6–33.0)
MCHC: 35.1 g/dL (ref 31.5–35.7)
MCV: 86 fL (ref 79–97)
Monocytes Absolute: 0.5 10*3/uL (ref 0.1–0.9)
Monocytes: 7 %
Neutrophils Absolute: 4.2 10*3/uL (ref 1.4–7.0)
Neutrophils: 55 %
Platelets: 208 10*3/uL (ref 150–450)
RBC: 4.35 x10E6/uL (ref 3.77–5.28)
RDW: 11.6 % — ABNORMAL LOW (ref 11.7–15.4)
WBC: 7.7 10*3/uL (ref 3.4–10.8)

## 2021-08-30 LAB — THYROID PANEL WITH TSH
Free Thyroxine Index: 1.5 (ref 1.2–4.9)
T3 Uptake Ratio: 23 % — ABNORMAL LOW (ref 24–39)
T4, Total: 6.4 ug/dL (ref 4.5–12.0)
TSH: 2.44 u[IU]/mL (ref 0.450–4.500)

## 2021-10-29 ENCOUNTER — Encounter: Payer: Self-pay | Admitting: Allergy

## 2021-10-29 ENCOUNTER — Other Ambulatory Visit: Payer: Self-pay

## 2021-10-29 ENCOUNTER — Ambulatory Visit (INDEPENDENT_AMBULATORY_CARE_PROVIDER_SITE_OTHER): Payer: Medicaid Other | Admitting: Allergy

## 2021-10-29 VITALS — BP 122/80 | HR 85 | Temp 98.0°F | Resp 18 | Ht 61.0 in | Wt 150.8 lb

## 2021-10-29 DIAGNOSIS — L500 Allergic urticaria: Secondary | ICD-10-CM

## 2021-10-29 DIAGNOSIS — T781XXD Other adverse food reactions, not elsewhere classified, subsequent encounter: Secondary | ICD-10-CM

## 2021-10-29 DIAGNOSIS — T7800XA Anaphylactic reaction due to unspecified food, initial encounter: Secondary | ICD-10-CM | POA: Insufficient documentation

## 2021-10-29 DIAGNOSIS — T7800XD Anaphylactic reaction due to unspecified food, subsequent encounter: Secondary | ICD-10-CM

## 2021-10-29 NOTE — Progress Notes (Signed)
Follow Up Note  RE: Rebecca Nash MRN: [id number] DOB: Mar 07, 1991 Date of Office Visit: 10/29/2021  Referring provider: Cain Saupe, MD Primary care provider: No primary care provider on file.  Chief Complaint: Allergic Reaction (No issues ) and Urticaria (No issue since her last visit due to food reaction. )  History of Present Illness: I had the pleasure of seeing Rebecca Nash for a follow up visit at the [facility] on 10/29/2021. She is a 30 y.o. female, who is being followed for adverse food reaction. Her previous allergy office visit was on 10/25/2020 with Dr. Selena Batten. Today is a regular follow up visit. Spanish interpreter present in person.   Adverse food reaction Currently avoiding shrimp.  She had a reaction after eating shrimp intentionally about 7 months ago and she started to have itchy skin and redness. She took benadryl with good benefit. She really misses eating shrimp.   Tolerates finned fish, lobster and crab with no issues.   Assessment and Plan: Aolani is a 30 y.o. female with: Adverse food reaction Past history - Reaction to scallops in the form of trouble breathing, nausea, flushing, nasal congestion, shaking. Resolved with liquid zyrtec. Now having issues with shrimp - rash and nausea. 2021 skin testing showed: positive to shellfish, shrimp, lobster, oyster. Negative to fish. Interim history - itching with shrimp. No issues with crab, lobster and finned fish. Okay to eat finned fish.  Continue to avoid shrimp and mollusks.  Get bloodwork For mild symptoms you can take over the counter antihistamines such as Benadryl and monitor symptoms closely. If symptoms worsen or if you have severe symptoms including breathing issues, throat closure, significant swelling, whole body hives, severe diarrhea and vomiting, lightheadedness then inject epinephrine and seek immediate medical care afterwards. Food action plan in place.   Return in  about 1 year (around 10/29/2022).  No orders of the defined types were placed in this encounter.  Lab Orders         Allergen Profile, Shellfish      Diagnostics: None.   Medication List:  Current Outpatient Medications  Medication Sig Dispense Refill   EPINEPHrine (EPIPEN 2-PAK) 0.3 mg/0.3 mL IJ SOAJ injection Inject 0.3 mg into the muscle as needed for anaphylaxis. Severe allergic reaction 1 each 11   ibuprofen (ADVIL) 600 MG tablet Take 1 tablet (600 mg total) by mouth every 6 (six) hours as needed. 30 tablet 0   No current facility-administered medications for this visit.   Allergies: Allergies  Allergen Reactions   Scallops [Shellfish Allergy] Swelling   I reviewed her past medical history, social history, family history, and environmental history and no significant changes have been reported from her previous visit.  Review of Systems  Constitutional:  Negative for appetite change, chills, fever and unexpected weight change.  HENT:  Positive for sore throat. Negative for congestion and rhinorrhea.   Eyes:  Negative for itching.  Respiratory:  Negative for cough, chest tightness, shortness of breath and wheezing.   Cardiovascular:  Negative for chest pain.  Gastrointestinal:  Negative for abdominal pain.  Genitourinary:  Negative for difficulty urinating.  Skin:  Negative for rash.  Allergic/Immunologic: Positive for food allergies.  Neurological:  Negative for headaches.   Objective: BP 122/80    Pulse 85    Temp 98 F (36.7 C)    Resp 18    Ht 5\' 1"  (1.549 m)    Wt 150 lb 12.8 oz (68.4 kg)  SpO2 100%    BMI 28.49 kg/m  Body mass index is 28.49 kg/m. Physical Exam Vitals and nursing note reviewed.  Constitutional:      Appearance: Normal appearance. She is well-developed.  HENT:     Head: Normocephalic and atraumatic.     Right Ear: External ear normal.     Left Ear: External ear normal.     Nose: Nose normal.     Mouth/Throat:     Mouth: Mucous membranes  are moist.     Pharynx: Oropharynx is clear.  Eyes:     Conjunctiva/sclera: Conjunctivae normal.  Cardiovascular:     Rate and Rhythm: Normal rate and regular rhythm.     Heart sounds: Normal heart sounds. No murmur heard.   No friction rub. No gallop.  Pulmonary:     Effort: Pulmonary effort is normal.     Breath sounds: Normal breath sounds. No wheezing, rhonchi or rales.  Abdominal:     Palpations: Abdomen is soft.  Musculoskeletal:     Cervical back: Neck supple.  Skin:    General: Skin is warm.     Findings: No rash.  Neurological:     Mental Status: She is alert and oriented to person, place, and time.  Psychiatric:        Behavior: Behavior normal.   Previous notes and tests were reviewed. The plan was reviewed with the patient/family, and all questions/concerned were addressed.  It was my pleasure to see Rebecca Nash today and participate in her care. Please feel free to contact me with any questions or concerns.  Sincerely,  Wyline Mood, DO Allergy & Immunology  [facility] office: [phone] [facility] office: [phone]

## 2021-10-29 NOTE — Assessment & Plan Note (Signed)
Past history - Reaction to scallops in the form of trouble breathing, nausea, flushing, nasal congestion, shaking. Resolved with liquid zyrtec. Now having issues with shrimp - rash and nausea. 2021 skin testing showed: positive to shellfish, shrimp, lobster, oyster. Negative to fish. Interim history - itching with shrimp. No issues with crab, lobster and finned fish.  Okay to eat finned fish.   Continue to avoid shrimp and mollusks.   Get bloodwork  For mild symptoms you can take over the counter antihistamines such as Benadryl and monitor symptoms closely. If symptoms worsen or if you have severe symptoms including breathing issues, throat closure, significant swelling, whole body hives, severe diarrhea and vomiting, lightheadedness then inject epinephrine and seek immediate medical care afterwards.  Food action plan in place.

## 2021-10-29 NOTE — Patient Instructions (Addendum)
Food: 2021 skin testing showed: positive to shellfish, shrimp, lobster, oyster. Negative to fish.  Okay to eat finned fish.  Continue to avoid shrimp and mollusks. Get bloodwork We are ordering labs, so please allow 1-2 weeks for the results to come back. With the newly implemented Cures Act, the labs might be visible to you at the same time that they become visible to me. However, I will not address the results until all of the results are back, so please be patient.  In the meantime, continue recommendations in your patient instructions, including avoidance measures (if applicable), until you hear from me.  For mild symptoms you can take over the counter antihistamines such as Benadryl and monitor symptoms closely. If symptoms worsen or if you have severe symptoms including breathing issues, throat closure, significant swelling, whole body hives, severe diarrhea and vomiting, lightheadedness then inject epinephrine and seek immediate medical care afterwards. Food action plan in place.   Follow up in 1 year or sooner if needed.

## 2021-10-31 LAB — ALLERGEN PROFILE, SHELLFISH
Clam IgE: 0.24 kU/L — AB
F023-IgE Crab: 1.09 kU/L — AB
F080-IgE Lobster: 0.15 kU/L — AB
F290-IgE Oyster: <0.1 kU/L
Scallop IgE: <0.1 kU/L
Shrimp IgE: 1.14 kU/L — AB

## 2021-12-04 ENCOUNTER — Other Ambulatory Visit: Payer: Self-pay | Admitting: Otolaryngology

## 2021-12-04 DIAGNOSIS — K118 Other diseases of salivary glands: Secondary | ICD-10-CM

## 2021-12-11 ENCOUNTER — Ambulatory Visit: Payer: Medicaid Other

## 2021-12-11 ENCOUNTER — Other Ambulatory Visit: Payer: Self-pay

## 2021-12-11 ENCOUNTER — Encounter: Payer: Self-pay | Admitting: Nurse Practitioner

## 2021-12-11 ENCOUNTER — Ambulatory Visit: Payer: Medicaid Other | Attending: Nurse Practitioner | Admitting: Nurse Practitioner

## 2021-12-11 DIAGNOSIS — R300 Dysuria: Secondary | ICD-10-CM | POA: Diagnosis not present

## 2021-12-11 DIAGNOSIS — G44229 Chronic tension-type headache, not intractable: Secondary | ICD-10-CM | POA: Diagnosis not present

## 2021-12-11 DIAGNOSIS — R399 Unspecified symptoms and signs involving the genitourinary system: Secondary | ICD-10-CM | POA: Diagnosis not present

## 2021-12-11 LAB — POCT URINALYSIS DIP (CLINITEK)
Bilirubin, UA: NEGATIVE
Blood, UA: NEGATIVE
Glucose, UA: NEGATIVE mg/dL
Ketones, POC UA: NEGATIVE mg/dL
Leukocytes, UA: NEGATIVE
Nitrite, UA: NEGATIVE
POC PROTEIN,UA: NEGATIVE
Spec Grav, UA: 1.01 (ref 1.010–1.025)
Urobilinogen, UA: 0.2 E.U./dL
pH, UA: 6 (ref 5.0–8.0)

## 2021-12-11 LAB — POCT URINE PREGNANCY: Preg Test, Ur: NEGATIVE

## 2021-12-11 MED ORDER — AMITRIPTYLINE HCL 10 MG PO TABS: 10.0000 mg | ORAL_TABLET | Freq: Every day | ORAL | 0 refills | Status: DC

## 2021-12-11 MED ORDER — SUMATRIPTAN SUCCINATE 50 MG PO TABS: 50.0000 mg | ORAL_TABLET | Freq: Once | ORAL | 1 refills | Status: DC

## 2021-12-11 NOTE — Addendum Note (Signed)
Addended by: Geryl Rankins on: 12/11/2021 11:03 PM   Modules accepted: Orders

## 2021-12-11 NOTE — Progress Notes (Addendum)
Virtual Visit via Telephone Note Due to national recommendations of social distancing due to [place] 19, telehealth visit is felt to be most appropriate for this patient at this time.  I discussed the limitations, risks, security and privacy concerns of performing an evaluation and management service by telephone and the availability of in person appointments. I also discussed with the patient that there may be a patient responsible charge related to this service. The patient expressed understanding and agreed to proceed.    I connected with Rebecca Nash on 12/11/21  at   9:10 AM EST  EDT by telephone and verified that I am speaking with the correct person using two identifiers.  Location of Patient: Private Residence   Location of Provider: [place] and [place] participating in Telemedicine visit: Geryl Rankins FNP-BC [place]   History of Present Illness: Telemedicine visit for: Headaches.  She has a past medical history of Depression, Late prenatal care, and Urticaria.   Endorses persistent headaches over the past 2 weeks. Associated symptoms include: nausea and photosensitivity. Headaches can last all day and also occurring at night. Headaches are located in the left occipital area. She denies any history of headaches like this in the past. She has been taking Excedrin OTC which sometimes helps and sometimes not. States she has been prescribed imitrex in the past   Endorses UTI symptoms of dysuria and lower back pain. Denies hematuria or vaginitis symptoms.   Past Medical History:  Diagnosis Date   Depression    was on meds prior to moving here post partum   Late prenatal care    Urticaria     Past Surgical History:  Procedure Laterality Date   TUBAL LIGATION Bilateral 01/28/2018   Procedure: POST PARTUM TUBAL LIGATION;  Surgeon: Gwynne Edinger, MD;  Location: [facility];  Service: Gynecology;   Laterality: Bilateral;    Family History  Problem Relation Age of Onset   Other Other        Moebius syndrome   Hypertension Mother    Colon cancer Mother    Cervical cancer Mother    Diabetes Mother    Hyperlipidemia Mother    Anemia Daughter        blood transfusion at 55mos   Vision loss Daughter        eye surgery   Other Daughter        Sotos syndrome   Hypertension Father    Diabetes Father    Asthma Sister    Allergic rhinitis Neg Hx    Angioedema Neg Hx    Atopy Neg Hx    Eczema Neg Hx    Immunodeficiency Neg Hx    Urticaria Neg Hx     Social History   Socioeconomic History   Marital status: Married    Spouse name: Not on file   Number of children: Not on file   Years of education: Not on file   Highest education level: Not on file  Occupational History   Not on file  Tobacco Use   Smoking status: Never   Smokeless tobacco: Never  Vaping Use   Vaping Use: Never used  Substance and Sexual Activity   Alcohol use: No   Drug use: No   Sexual activity: Yes    Birth control/protection: None  Other Topics Concern   Not on file  Social History Narrative   Not on file   Social Determinants of  Health   Financial Resource Strain: Not on file  Food Insecurity: Not on file  Transportation Needs: Not on file  Physical Activity: Not on file  Stress: Not on file  Social Connections: Not on file     Observations/Objective: Awake, alert and oriented x 3   Review of Systems  Constitutional:  Negative for fever, malaise/fatigue and weight loss.  HENT: Negative.  Negative for nosebleeds.   Eyes: Negative.  Negative for blurred vision, double vision and photophobia.  Respiratory: Negative.  Negative for cough and shortness of breath.   Cardiovascular: Negative.  Negative for chest pain, palpitations and leg swelling.  Gastrointestinal: Negative.  Negative for heartburn, nausea and vomiting.  Genitourinary:  Positive for dysuria. Negative for frequency,  hematuria and urgency.       SEE HPI  Musculoskeletal:  Positive for back pain. Negative for myalgias.  Neurological:  Positive for headaches. Negative for dizziness, focal weakness and seizures.  Psychiatric/Behavioral: Negative.  Negative for suicidal ideas.    Assessment and Plan: Diagnoses and all orders for this visit:  UTI symptoms -     POCT URINALYSIS DIP (CLINITEK)  Chronic tension-type headache, not intractable -     amitriptyline (ELAVIL) 10 MG tablet; Take 1 tablet (10 mg total) by mouth at bedtime.   Dysuria -     POCT urine pregnancy     Follow Up Instructions Return if symptoms worsen or fail to improve.     I discussed the assessment and treatment plan with the patient. The patient was provided an opportunity to ask questions and all were answered. The patient agreed with the plan and demonstrated an understanding of the instructions.   The patient was advised to call back or seek an in-person evaluation if the symptoms worsen or if the condition fails to improve as anticipated.  I provided 11 minutes of non-face-to-face time during this encounter including median intraservice time, reviewing previous notes, labs, imaging, medications and explaining diagnosis and management.  Gildardo Pounds, FNP-BC

## 2021-12-25 ENCOUNTER — Telehealth: Payer: Medicaid Other | Admitting: Nurse Practitioner

## 2021-12-25 ENCOUNTER — Telehealth: Payer: Self-pay | Admitting: Nurse Practitioner

## 2021-12-25 NOTE — Telephone Encounter (Signed)
Called twice with assistance from interpreter. Unable to LVM

## 2021-12-26 ENCOUNTER — Other Ambulatory Visit: Payer: Medicaid Other

## 2022-01-07 ENCOUNTER — Other Ambulatory Visit: Payer: Self-pay | Admitting: Nurse Practitioner

## 2022-01-07 DIAGNOSIS — G44229 Chronic tension-type headache, not intractable: Secondary | ICD-10-CM

## 2022-02-27 ENCOUNTER — Ambulatory Visit: Payer: Medicaid Other | Admitting: Physician Assistant

## 2022-07-02 ENCOUNTER — Encounter (HOSPITAL_COMMUNITY): Payer: Self-pay

## 2022-07-02 ENCOUNTER — Ambulatory Visit (HOSPITAL_COMMUNITY)
Admission: EM | Admit: 2022-07-02 | Discharge: 2022-07-02 | Disposition: A | Discharge status: Home / Self Care | Payer: Medicaid Other | Attending: Physician Assistant | Admitting: Physician Assistant

## 2022-07-02 ENCOUNTER — Ambulatory Visit (INDEPENDENT_AMBULATORY_CARE_PROVIDER_SITE_OTHER): Payer: Medicaid Other

## 2022-07-02 ENCOUNTER — Ambulatory Visit: Payer: Self-pay | Admitting: *Deleted

## 2022-07-02 DIAGNOSIS — S99922A Unspecified injury of left foot, initial encounter: Secondary | ICD-10-CM | POA: Diagnosis not present

## 2022-07-02 DIAGNOSIS — S92425A Nondisplaced fracture of distal phalanx of left great toe, initial encounter for closed fracture: Secondary | ICD-10-CM | POA: Diagnosis not present

## 2022-07-02 MED ORDER — IBUPROFEN 600 MG PO TABS: 600.0000 mg | ORAL_TABLET | Freq: Four times a day (QID) | ORAL | 0 refills | Status: DC | PRN

## 2022-07-02 NOTE — Telephone Encounter (Signed)
  Chief Complaint: Toe Injury Symptoms: Pt reports dropped heavy wooden object on left foot. Reports left toe and top of foot "Purple." Swelling, 8/10 pain. "Looks broken in middle." Frequency: Occurred Monday Pertinent Negatives: Patient denies ; Pt is able to walk unassisted "But very painful." Disposition: [] ED /[x] Urgent Care (no appt availability in office) / [] Appointment(In office/virtual)/ []  [place] [facility]/ [] Home Care/ [] Refused Recommended Disposition /[] [place] [facility]/ []  Follow-up with PCP Additional Notes: Advised UC. States will follow disposition. Reason for Disposition  Looks like a dislocated joint (e.g., crooked or deformed)  Answer Assessment - Initial Assessment Questions 1. MECHANISM: "How did the injury happen?"      "Something fell on it. Heavy wood" 2. ONSET: "When did the injury happen?" (Minutes or hours ago)      Monday AM 3. LOCATION: "What part of the toe is injured?" "Is the nail damaged?"      Great toe, left foot 4. APPEARANCE of TOE INJURY: "What does the injury look like?"      Toe purple, deformed, top of foot discolored, swelling. "Looked broken in middle." 5. SEVERITY: "Can you use the foot normally?" "Can you walk?"      Can walk a little 6. SIZE: For cuts, bruises, or swelling, ask: "How large is it?" (e.g., inches or centimeters;  entire toe)      Toe purple, nail purple 7. PAIN: "Is there pain?" If Yes, ask: "How bad is the pain?"   (e.g., Scale 1-10; or mild, moderate, severe)     8/10 8. TETANUS: For any breaks in the skin, ask: "When was the last tetanus booster?"     NA 9. DIABETES: "Do you have a history of diabetes or poor circulation in the feet?"     No 10. OTHER SYMPTOMS: "Do you have any other symptoms?"  Protocols used: Toe Injury-A-AH

## 2022-07-02 NOTE — Discharge Instructions (Signed)
You have a broken toe.  It is very importantly follow-up with podiatry.  Call them to schedule an appointment for thing tomorrow.  Use ibuprofen for pain.  Keep your foot elevated and use ice.  If you have any worsening pain, numbness, tingling, difficulty bearing weight, cold sensation of the toe you should be seen immediately.

## 2022-07-02 NOTE — ED Provider Notes (Signed)
[facility]    CSN: [id number] Arrival date & time: 07/02/22  1537      History   Chief Complaint Chief Complaint  Patient presents with   Foot Injury    HPI Rebecca Nash is a 31 y.o. female.   Patient presents today with a 2 to 3-day history of left great toe pain.  Reports that she tripped over a step in her backyard jamming her left great toe.  She reports ongoing pain in this area that spreads into medial portion of left foot.  Pain is rated 7 on a 0-10 pain scale, described as throbbing, worse with palpation, no alleviating factors identified.  She has tried Naprosyn with temporary improvement of symptoms.  She is confident she is not pregnant as she previously had a tubal ligation.  She is able to ambulate but with difficulty due to pain.  She denies any previous injury or surgery involving her foot.    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   Adverse food reaction 10/25/2020   History of shoulder dystocia in prior pregnancy 01/30/2018   Thrombocytopenia (HCC) 01/28/2018   Family history of congenital anomalies 08/10/2013   Family history of genetic disorder 08/10/2013    Past Surgical History:  Procedure Laterality Date   TUBAL LIGATION Bilateral 01/28/2018   Procedure: POST PARTUM TUBAL LIGATION;  Surgeon: Kathrynn Running, MD;  Location: [facility];  Service: Gynecology;  Laterality: Bilateral;    OB History     Gravida  3   Para  3   Term  3   Preterm  0   AB  0   Living  3      SAB  0   IAB  0   Ectopic  0   Multiple  0   Live Births  3            Home Medications    Prior to Admission medications   Medication Sig Start Date End Date Taking? Authorizing Provider  amitriptyline (ELAVIL)  10 MG tablet TAKE 1 TABLET(10 MG) BY MOUTH AT BEDTIME 01/07/22   Hoy Register, MD  EPINEPHrine (EPIPEN 2-PAK) 0.3 mg/0.3 mL IJ SOAJ injection Inject 0.3 mg into the muscle as needed for anaphylaxis. Severe allergic reaction 08/25/20   Fulp, Cammie, MD  ibuprofen (ADVIL) 600 MG tablet Take 1 tablet (600 mg total) by mouth every 6 (six) hours as needed. 07/02/22   Demian Maisel, Noberto Retort, PA-C    Family History Family History  Problem Relation Age of Onset   Other Other        Moebius syndrome   Hypertension Mother    Colon cancer Mother    Cervical cancer Mother    Diabetes Mother    Hyperlipidemia Mother    Anemia Daughter        blood transfusion at 31mos   Vision loss Daughter        eye surgery   Other Daughter        Sotos syndrome   Hypertension Father    Diabetes Father    Asthma Sister    Allergic rhinitis Neg Hx    Angioedema Neg Hx    Atopy Neg Hx  Eczema Neg Hx    Immunodeficiency Neg Hx    Urticaria Neg Hx     Social History Social History   Tobacco Use   Smoking status: Never   Smokeless tobacco: Never  Vaping Use   Vaping Use: Never used  Substance Use Topics   Alcohol use: No   Drug use: No     Allergies   Scallops [shellfish allergy]   Review of Systems Review of Systems  Constitutional:  Positive for activity change. Negative for appetite change, fatigue and fever.  Gastrointestinal:  Negative for abdominal pain, diarrhea, nausea and vomiting.  Musculoskeletal:  Positive for arthralgias, gait problem and myalgias.  Skin:  Positive for color change. Negative for wound.  Neurological:  Negative for dizziness, weakness, light-headedness, numbness and headaches.     Physical Exam Triage Vital Signs ED Triage Vitals  Enc Vitals Group     BP 07/02/22 1651 109/68     Pulse Rate 07/02/22 1651 70     Resp --      Temp 07/02/22 1651 98 F (36.7 C)     Temp Source 07/02/22 1651 Oral     SpO2 07/02/22 1651 100 %     Weight --      Height --       Head Circumference --      Peak Flow --      Pain Score 07/02/22 1649 7     Pain Loc --      Pain Edu? --      Excl. in GC? --    No data found.  Updated Vital Signs BP 109/68 (BP Location: Right Arm)   Pulse 70   Temp 98 F (36.7 C) (Oral)   LMP 06/16/2022   SpO2 100%   Visual Acuity Right Eye Distance:   Left Eye Distance:   Bilateral Distance:    Right Eye Near:   Left Eye Near:    Bilateral Near:     Physical Exam Vitals reviewed.  Constitutional:      General: She is awake. She is not in acute distress.    Appearance: Normal appearance. She is well-developed. She is not ill-appearing.     Comments: Very pleasant female appears stated age in no acute distress sitting comfortably in exam room  HENT:     Head: Normocephalic and atraumatic.  Cardiovascular:     Rate and Rhythm: Normal rate and regular rhythm.     Pulses:          Posterior tibial pulses are 2+ on the right side and 2+ on the left side.     Heart sounds: Normal heart sounds, S1 normal and S2 normal. No murmur heard.    Comments: Capillary refill within 2 seconds left foot Pulmonary:     Effort: Pulmonary effort is normal.     Breath sounds: Normal breath sounds. No wheezing, rhonchi or rales.     Comments: Clear to auscultation bilaterally Musculoskeletal:     Left foot: Decreased range of motion. No deformity or bunion.  Feet:     Left foot:     Protective Sensation: 10 sites tested.  10 sites sensed.     Skin integrity: No ulcer, skin breakdown, erythema or warmth.     Comments: Left foot neurovascularly intact.  Significant bruising noted along left great toe into dorsal/medial foot.  Decreased range of motion of great toe but normal range of motion at ankle.  Distal portion of left great toe nail broken  without involvement of nailbed. Psychiatric:        Behavior: Behavior is cooperative.      UC Treatments / Results  Labs (all labs ordered are listed, but only abnormal results are  displayed) Labs Reviewed - No data to display  EKG   Radiology DG Foot Complete Left  Result Date: 07/02/2022 CLINICAL DATA:  Pt present's w/ right foot pain on Sunday due to injuries from a fall this past Sunday. Pt was walking down the stairs and slipped and fell, hitting the left great toe. Toe noted to be bruised, swollen, and have damage to the nail EXAM: LEFT FOOT - COMPLETE 3+ VIEW COMPARISON:  None Available. FINDINGS: There is a nondisplaced fracture at the base of the distal phalanx of the great toe with intra-articular extension. No evidence of dislocation. There is no evidence of arthropathy or other focal bone abnormality. Soft tissues are unremarkable. IMPRESSION: Nondisplaced fracture at the base of the distal phalanx of the left great toe. Electronically Signed   By: Emmaline Kluver M.D.   On: 07/02/2022 17:19    Procedures Procedures (including critical care time)  Medications Ordered in UC Medications - No data to display  Initial Impression / Assessment and Plan / UC Course  I have reviewed the triage vital signs and the nursing notes.  Pertinent labs & imaging results that were available during my care of the patient were reviewed by me and considered in my medical decision making (see chart for details).     X-ray obtained given mechanism of injury showed nondisplaced fracture at the base of the distal phalanx of left great toe.  Patient was placed in a postop shoe after buddy taping to for comfort and support.  We will start ibuprofen 600 mg up to 4 times a day for pain relief.  Discussed she should not take additional NSAIDs with this medication due to risk of GI bleeding.  Can use Tylenol for breakthrough pain.  Recommended RICE protocol.  Discussed that given his great toe I do recommend she follow-up with podiatry as soon as possible.  She was given contact information for local provider with instruction to call to schedule an appointment first thing tomorrow.  If  she has any worsening symptoms including increased pain, swelling, numbness, paresthesias she needs to be seen immediately.  Strict return precautions given.  Work excuse note provided.  Final Clinical Impressions(s) / UC Diagnoses   Final diagnoses:  Closed nondisplaced fracture of distal phalanx of left great toe, initial encounter  Injury of toe on left foot, initial encounter     Discharge Instructions      You have a broken toe.  It is very importantly follow-up with podiatry.  Call them to schedule an appointment for thing tomorrow.  Use ibuprofen for pain.  Keep your foot elevated and use ice.  If you have any worsening pain, numbness, tingling, difficulty bearing weight, cold sensation of the toe you should be seen immediately.     ED Prescriptions     Medication Sig Dispense Auth. Provider   ibuprofen (ADVIL) 600 MG tablet Take 1 tablet (600 mg total) by mouth every 6 (six) hours as needed. 30 tablet Bobi Daudelin, Noberto Retort, PA-C      PDMP not reviewed this encounter.   Jeani Hawking, PA-C 07/02/22 1745

## 2022-07-02 NOTE — ED Triage Notes (Signed)
Pt present's w/ right foot pain on Sunday due to injuries from a fall this past Sunday. Pt was walking down the stairs and slipped and fell, hitting the left great toe. Toe noted to be bruised, swollen, and have damage to the nail

## 2022-07-03 NOTE — Telephone Encounter (Signed)
Pt seen in urgent care.

## 2022-07-06 ENCOUNTER — Encounter: Payer: Self-pay | Admitting: Podiatry

## 2022-07-06 ENCOUNTER — Ambulatory Visit: Payer: Medicaid Other | Admitting: Podiatry

## 2022-07-06 DIAGNOSIS — L02612 Cutaneous abscess of left foot: Secondary | ICD-10-CM

## 2022-07-06 DIAGNOSIS — S92415A Nondisplaced fracture of proximal phalanx of left great toe, initial encounter for closed fracture: Secondary | ICD-10-CM

## 2022-07-06 DIAGNOSIS — L03032 Cellulitis of left toe: Secondary | ICD-10-CM | POA: Diagnosis not present

## 2022-07-06 MED ORDER — DOXYCYCLINE HYCLATE 100 MG PO TABS: 100.0000 mg | ORAL_TABLET | Freq: Two times a day (BID) | ORAL | 1 refills | Status: DC

## 2022-07-06 NOTE — Patient Instructions (Signed)

## 2022-07-06 NOTE — Progress Notes (Signed)
Subjective:   Patient ID: Rebecca Nash, female   DOB: 31 y.o.   MRN: [id number]   HPI Patient presents with interpreter with a severely traumatized left hallux nail with some proximal redness across the entire lower bed and lifting of the nailbed on the medial lateral and into the central side.  Also was diagnosed with a fracture at the emergency room that they be checked.  Patient does not speak English does not smoke tries to stay active and is wearing surgical shoe   Review of Systems  All other systems reviewed and are negative.       Objective:  Physical Exam Vitals and nursing note reviewed.  Constitutional:      Appearance: She is well-developed.  Pulmonary:     Effort: Pulmonary effort is normal.  Musculoskeletal:        General: Normal range of motion.  Skin:    General: Skin is warm.  Neurological:     Mental Status: She is alert.     Neurovascular status intact muscle strength was found to be adequate range of motion adequate.  Patient is found to have a severely damaged the left hallux nailbed with proximal redness that does not extend to the inner phalangeal joint and also damage to the medial and lateral side with pain.  Patient did have severe trauma to the bed 8 days ago with dropping a large weight on it and does have a fracture when I evaluated her previous x-ray     Assessment:  Significant paronychia infection of the left hallux nailbed secondary to trauma involving the medial lateral and central portion of the bed and also a small fleck fracture of the proximal phalanx     Plan:  Age and PE and through interpreter explained condition and treatment.  I went ahead today and I anesthetized the left hallux and using sterile instrumentation I remove the medial lateral borders found the central to be loose remove that entirely flushed out of the bed and removed all necrotic tissue placed on doxycycline twice a day instructed on soaks and continued  surgical shoe usage.  Patient will be seen back as needed strict instructions if any proximal erythema edema drainage were to occur or any systemic signs of infection to go to the emergency room and call us as needed  X-rays evaluated indicated a small nondisplaced fracture which should heal uneventfully left hallux

## 2023-04-23 ENCOUNTER — Ambulatory Visit: Payer: Self-pay | Admitting: *Deleted

## 2023-04-23 ENCOUNTER — Ambulatory Visit: Payer: Medicaid Other | Attending: Nurse Practitioner | Admitting: Nurse Practitioner

## 2023-04-23 ENCOUNTER — Encounter: Payer: Self-pay | Admitting: Nurse Practitioner

## 2023-04-23 VITALS — BP 107/69 | HR 74 | Temp 99.8°F | Ht 61.0 in | Wt 133.2 lb

## 2023-04-23 DIAGNOSIS — K121 Other forms of stomatitis: Secondary | ICD-10-CM | POA: Diagnosis not present

## 2023-04-23 MED ORDER — DEXAMETHASONE 0.5 MG/5ML PO ELIX: ORAL_SOLUTION | ORAL | 0 refills | Status: DC

## 2023-04-23 MED ORDER — LIDOCAINE 5 % EX OINT: 1.0000 | TOPICAL_OINTMENT | CUTANEOUS | 0 refills | Status: DC | PRN

## 2023-04-23 NOTE — Telephone Encounter (Signed)
Reason for Disposition  [1] MILD-MODERATE mouth pain AND [2] present > 3 days  Answer Assessment - Initial Assessment Questions 1. ONSET: "When did the mouth start hurting?" (e.g., hours or days ago)      Pt calling in with a Spanish interpreter. I have sores in my mouth.   Started 15 days ago.  Never had this before.  No recent illnesses or URI.   No new medications. 2. SEVERITY: "How bad is the pain?" (Scale 1-10; mild, moderate or severe)   - MILD (1-3):  doesn't interfere with eating or normal activities   - MODERATE (4-7): interferes with eating some solids and normal activities   - SEVERE (8-10):  excruciating pain, interferes with most normal activities   - SEVERE DYSPHAGIA: can't swallow liquids, drooling      3. SORES: "Are there any sores or ulcers in the mouth?" If Yes, ask: "What part of the mouth are the sores in?"     The sores inside my mouth and my throat and now starting on the lips.   Never had fever blisters.    4. FEVER: "Do you have a fever?" If Yes, ask: "What is your temperature, how was it measured, and when did it start?"     I felt like I had a high fever yesterday but not today.  Had body aches and chills yesterday.   I'm taking Tylenol.    5. CAUSE: "What do you think is causing the mouth pain?"     Don't know 6. OTHER SYMPTOMS: "Do you have any other symptoms?" (e.g., difficulty breathing)     My throat is sore and headache.  Protocols used: Mouth Pain-A-AH

## 2023-04-23 NOTE — Telephone Encounter (Signed)
  Chief Complaint: sores in mouth and throat for 15 days. Symptoms: above  Now starting on her lips Frequency: For last 15 days Pertinent Negatives: Patient denies having this before Disposition: [] ED /[] Urgent Care (no appt availability in office) / [x] Appointment(In office/virtual)/ []  [facility]/ [] Home Care/ [] Refused Recommended Disposition /[] [facility]/ []  Follow-up with PCP Additional Notes: Appt made for today with Bertram Denver, NP for 1:30.     Pt. Called in with a Spanish interpreter but she got disconnected at the end of the conversation.   Pt. Spoke enough English for me to give her the address and verify the time.

## 2023-04-23 NOTE — Progress Notes (Signed)
Assessment & Plan:  Rebecca Nash was seen today for mouth lesions.  Diagnoses and all orders for this visit:  Stomatitis -     dexamethasone 0.5 MG/5ML elixir; 5 mL swish and spit three to four times daily. It is important to keep the medication in the mouth for five minutes prior to spitting it out. Do not rinse afterward and avoid eating or drinking for 30 minutes. -     HCV Ab w Reflex to Quant PCR -     HIV antibody (with reflex) -     CBC with Differential -     CMP14+EGFR -     Thyroid Panel With TSH -     Hemoglobin A1c -     Mononucleosis screen -     lidocaine (XYLOCAINE) 5 % ointment; Apply 1 Application topically as needed. For numbing -     Lupus anticoagulant panel -     HSV 1 and 2 Ab, IgG    Patient has been counseled on age-appropriate routine health concerns for screening and prevention. These are reviewed and up-to-date. Referrals have been placed accordingly. Immunizations are up-to-date or declined.    Subjective:   Chief Complaint  Patient presents with   Mouth Lesions   HPI Rebecca Nash 32 y.o. female presents to office today for evaluation or mouth sores  VRI was used to communicate directly with patient for the entire encounter including providing detailed patient instructions.     Stomatitis She endorses painful small sores in her mouth for the past several weeks. She does have braces on exam today but states she has had braces for over 1 year. Denies any recent dental work or bracket changes. Associated symptoms: sore throat, headache and fever with Tmax 99.5. Lesions are also present around her hips.  She denies eating any recent foods that she has a known food intolerance or allergy to.  She is taking tylenol for fever.   Review of Systems  Constitutional:  Positive for fever. Negative for malaise/fatigue and weight loss.  HENT: Negative.  Negative for nosebleeds.        SEE HPI  Eyes: Negative.  Negative for blurred vision, double vision  and photophobia.  Respiratory: Negative.  Negative for cough and shortness of breath.   Cardiovascular: Negative.  Negative for chest pain, palpitations and leg swelling.  Gastrointestinal: Negative.  Negative for heartburn, nausea and vomiting.  Musculoskeletal: Negative.  Negative for myalgias.  Neurological: Negative.  Negative for dizziness, focal weakness, seizures and headaches.  Psychiatric/Behavioral: Negative.  Negative for suicidal ideas.     Past Medical History:  Diagnosis Date   Depression    was on meds prior to moving here post partum   Late prenatal care    Urticaria     Past Surgical History:  Procedure Laterality Date   TUBAL LIGATION Bilateral 01/28/2018   Procedure: POST PARTUM TUBAL LIGATION;  Surgeon: Kathrynn Running, MD;  Location: [facility] BIRTHING SUITES;  Service: Gynecology;  Laterality: Bilateral;    Family History  Problem Relation Age of Onset   Other Other        Moebius syndrome   Hypertension Mother    Colon cancer Mother    Cervical cancer Mother    Diabetes Mother    Hyperlipidemia Mother    Anemia Daughter        blood transfusion at 10mos   Vision loss Daughter        eye surgery   Other Daughter  Sotos syndrome   Hypertension Father    Diabetes Father    Asthma Sister    Allergic rhinitis Neg Hx    Angioedema Neg Hx    Atopy Neg Hx    Eczema Neg Hx    Immunodeficiency Neg Hx    Urticaria Neg Hx     Social History Reviewed with no changes to be made today.   Outpatient Medications Prior to Visit  Medication Sig Dispense Refill   amitriptyline (ELAVIL) 10 MG tablet TAKE 1 TABLET(10 MG) BY MOUTH AT BEDTIME (Patient not taking: Reported on 04/23/2023) 30 tablet 1   doxycycline (VIBRA-TABS) 100 MG tablet Take 1 tablet (100 mg total) by mouth 2 (two) times daily. (Patient not taking: Reported on 04/23/2023) 16 tablet 1   EPINEPHrine (EPIPEN 2-PAK) 0.3 mg/0.3 mL IJ SOAJ injection Inject 0.3 mg into the muscle as needed for  anaphylaxis. Severe allergic reaction (Patient not taking: Reported on 04/23/2023) 1 each 11   ibuprofen (ADVIL) 600 MG tablet Take 1 tablet (600 mg total) by mouth every 6 (six) hours as needed. (Patient not taking: Reported on 04/23/2023) 30 tablet 0   No facility-administered medications prior to visit.    Allergies  Allergen Reactions   Scallops [Shellfish Allergy] Swelling       Objective:    BP 107/69 (BP Location: Left Arm, Patient Position: Sitting, Cuff Size: Normal)   Pulse 74   Temp 99.8 F (37.7 C) (Oral)   Ht 5\' 1"  (1.549 m)   Wt 133 lb 3.2 oz (60.4 kg)   LMP 04/08/2023 (Exact Date)   SpO2 98%   BMI 25.17 kg/m  Wt Readings from Last 3 Encounters:  04/23/23 133 lb 3.2 oz (60.4 kg)  10/29/21 150 lb 12.8 oz (68.4 kg)  08/29/21 149 lb 9.6 oz (67.9 kg)    Physical Exam Vitals and nursing note reviewed.  Constitutional:      Appearance: She is well-developed.  HENT:     Head: Normocephalic and atraumatic.     Mouth/Throat:     Lips: Lesions present.     Mouth: Oral lesions present.     Dentition: Gum lesions present.     Tongue: Lesions present.     Palate: Lesions present.     Pharynx: No pharyngeal swelling, oropharyngeal exudate, posterior oropharyngeal erythema or uvula swelling.     Tonsils: No tonsillar abscesses.  Cardiovascular:     Rate and Rhythm: Normal rate and regular rhythm.     Heart sounds: Normal heart sounds. No murmur heard.    No friction rub. No gallop.  Pulmonary:     Effort: Pulmonary effort is normal. No tachypnea or respiratory distress.     Breath sounds: Normal breath sounds. No decreased breath sounds, wheezing, rhonchi or rales.  Chest:     Chest wall: No tenderness.  Abdominal:     General: Bowel sounds are normal.     Palpations: Abdomen is soft.  Musculoskeletal:        General: Normal range of motion.     Cervical back: Normal range of motion.  Skin:    General: Skin is warm and dry.  Neurological:     Mental Status:  She is alert and oriented to person, place, and time.     Coordination: Coordination normal.  Psychiatric:        Behavior: Behavior normal. Behavior is cooperative.        Thought Content: Thought content normal.  Judgment: Judgment normal.          Patient has been counseled extensively about nutrition and exercise as well as the importance of adherence with medications and regular follow-up. The patient was given clear instructions to go to ER or return to medical center if symptoms don't improve, worsen or new problems develop. The patient verbalized understanding.   Follow-up: Return if symptoms worsen or fail to improve.   Claiborne Rigg, FNP-BC [facility] [place], [place] [phone]   04/23/2023, 2:17 PM

## 2023-04-24 LAB — HSV 1 AND 2 AB, IGG
HSV 1 Glycoprotein G Ab, IgG: 31.7 index — ABNORMAL HIGH (ref 0.00–0.90)
HSV 2 IgG, Type Spec: <0.91 index (ref 0.00–0.90)

## 2023-04-25 LAB — CBC WITH DIFFERENTIAL/PLATELET
Basophils Absolute: 0 10*3/uL (ref 0.0–0.2)
Basos: 0 %
EOS (ABSOLUTE): 0.1 10*3/uL (ref 0.0–0.4)
Eos: 1 %
Hematocrit: 40.4 % (ref 34.0–46.6)
Hemoglobin: 13.3 g/dL (ref 11.1–15.9)
Immature Grans (Abs): 0 10*3/uL (ref 0.0–0.1)
Immature Granulocytes: 0 %
Lymphocytes Absolute: 2.1 10*3/uL (ref 0.7–3.1)
Lymphs: 31 %
MCH: 29 pg (ref 26.6–33.0)
MCHC: 32.9 g/dL (ref 31.5–35.7)
MCV: 88 fL (ref 79–97)
Monocytes Absolute: 0.4 10*3/uL (ref 0.1–0.9)
Monocytes: 6 %
Neutrophils Absolute: 4 10*3/uL (ref 1.4–7.0)
Neutrophils: 62 %
Platelets: 203 10*3/uL (ref 150–450)
RBC: 4.59 x10E6/uL (ref 3.77–5.28)
RDW: 11.8 % (ref 11.7–15.4)
WBC: 6.6 10*3/uL (ref 3.4–10.8)

## 2023-04-25 LAB — CMP14+EGFR
ALT: 15 IU/L (ref 0–32)
AST: 16 IU/L (ref 0–40)
Albumin/Globulin Ratio: 1.7
Albumin: 4.6 g/dL (ref 3.9–4.9)
Alkaline Phosphatase: 37 IU/L — ABNORMAL LOW (ref 44–121)
BUN/Creatinine Ratio: 17 (ref 9–23)
BUN: 12 mg/dL (ref 6–20)
Bilirubin Total: 0.4 mg/dL (ref 0.0–1.2)
CO2: 23 mmol/L (ref 20–29)
Calcium: 10 mg/dL (ref 8.7–10.2)
Chloride: 103 mmol/L (ref 96–106)
Creatinine, Ser: 0.69 mg/dL (ref 0.57–1.00)
Globulin, Total: 2.7 g/dL (ref 1.5–4.5)
Glucose: 84 mg/dL (ref 70–99)
Potassium: 3.9 mmol/L (ref 3.5–5.2)
Sodium: 140 mmol/L (ref 134–144)
Total Protein: 7.3 g/dL (ref 6.0–8.5)
eGFR: 119 mL/min/{1.73_m2} (ref >59)

## 2023-04-25 LAB — HCV AB W REFLEX TO QUANT PCR: HCV Ab: NONREACTIVE

## 2023-04-25 LAB — LUPUS ANTICOAGULANT PANEL
Dilute Viper Venom Time: 29.5 s (ref 0.0–47.0)
PTT Lupus Anticoagulant: 34.1 s (ref 0.0–43.5)

## 2023-04-25 LAB — HEMOGLOBIN A1C
Est. average glucose Bld gHb Est-mCnc: 105 mg/dL
Hgb A1c MFr Bld: 5.3 % (ref 4.8–5.6)

## 2023-04-25 LAB — HIV ANTIBODY (ROUTINE TESTING W REFLEX): HIV Screen 4th Generation wRfx: NONREACTIVE

## 2023-04-25 LAB — HCV INTERPRETATION

## 2023-04-25 LAB — THYROID PANEL WITH TSH
Free Thyroxine Index: 1.9 (ref 1.2–4.9)
T3 Uptake Ratio: 27 % (ref 24–39)
T4, Total: 7.1 ug/dL (ref 4.5–12.0)
TSH: 1.75 u[IU]/mL (ref 0.450–4.500)

## 2023-04-25 LAB — MONONUCLEOSIS SCREEN: Mono Screen: NEGATIVE

## 2023-04-30 ENCOUNTER — Other Ambulatory Visit: Payer: Self-pay | Admitting: Nurse Practitioner

## 2023-04-30 DIAGNOSIS — B001 Herpesviral vesicular dermatitis: Secondary | ICD-10-CM

## 2023-04-30 MED ORDER — VALACYCLOVIR HCL 1 G PO TABS: 1000.0000 mg | ORAL_TABLET | Freq: Two times a day (BID) | ORAL | 1 refills | Status: AC

## 2023-05-30 ENCOUNTER — Encounter: Payer: Medicaid Other | Admitting: Nurse Practitioner

## 2024-07-19 ENCOUNTER — Telehealth: Payer: Self-pay | Admitting: Nurse Practitioner

## 2024-07-19 NOTE — Telephone Encounter (Signed)
Pt confirmed appt 9/9

## 2024-07-20 ENCOUNTER — Encounter: Payer: Self-pay | Admitting: Nurse Practitioner

## 2024-07-20 ENCOUNTER — Ambulatory Visit: Attending: Nurse Practitioner | Admitting: Nurse Practitioner

## 2024-07-20 ENCOUNTER — Other Ambulatory Visit (HOSPITAL_COMMUNITY)
Admission: RE | Admit: 2024-07-20 | Discharge: 2024-07-20 | Disposition: A | Discharge status: Home / Self Care | Source: Ambulatory Visit | Attending: Nurse Practitioner | Admitting: Nurse Practitioner

## 2024-07-20 VITALS — BP 98/67 | HR 80 | Resp 18 | Ht 61.0 in | Wt 139.4 lb

## 2024-07-20 DIAGNOSIS — Z0000 Encounter for general adult medical examination without abnormal findings: Secondary | ICD-10-CM | POA: Insufficient documentation

## 2024-07-20 DIAGNOSIS — R350 Frequency of micturition: Secondary | ICD-10-CM | POA: Diagnosis not present

## 2024-07-20 DIAGNOSIS — N760 Acute vaginitis: Secondary | ICD-10-CM | POA: Diagnosis not present

## 2024-07-20 DIAGNOSIS — N6312 Unspecified lump in the right breast, upper inner quadrant: Secondary | ICD-10-CM | POA: Diagnosis not present

## 2024-07-20 DIAGNOSIS — Z91013 Allergy to seafood: Secondary | ICD-10-CM

## 2024-07-20 MED ORDER — EPINEPHRINE 0.3 MG/0.3ML IJ SOAJ: 0.3000 mg | INTRAMUSCULAR | 11 refills | Status: AC | PRN

## 2024-07-20 NOTE — Progress Notes (Signed)
 Assessment & Plan:  Rebecca Nash was seen today for annual exam.  Diagnoses and all orders for this visit:  Encounter for annual physical exam -     CMP14+EGFR -     CBC with Differential/Platelet -     Cervicovaginal ancillary only  Urine frequency -     Urinalysis, Complete  Mass of upper inner quadrant of right breast -     MM 3D SCREENING MAMMOGRAM BILATERAL BREAST; Future -     MM 3D DIAGNOSTIC MAMMOGRAM BILATERAL BREAST; Future -     US  BREAST COMPLETE UNI RIGHT INC AXILLA; Future  Acute vaginitis -     Cervicovaginal ancillary only  Seafood allergy -     EPINEPHrine  (EPIPEN  2-PAK) 0.3 mg/0.3 mL IJ SOAJ injection; Inject 0.3 mg into the muscle as needed for anaphylaxis. Severe allergic reaction    Patient has been counseled on age-appropriate routine health concerns for screening and prevention. These are reviewed and up-to-date. Referrals have been placed accordingly. Immunizations are up-to-date or declined.    Subjective:   Chief Complaint  Patient presents with   Annual Exam    Rebecca Nash 33 y.o. female presents to office today   She endorses urinary frequency ongoing for several years. She is G3P3 (all vaginal births). Does not feel any tissue or bulge protruding from vagina. Also notes strong unusual vaginal odor   She has numerous cysts in the right breast on exam today. She palpates a lump in the right upper inner quadrant that is different from the others.   Review of Systems  Constitutional:  Negative for fever, malaise/fatigue and weight loss.  HENT: Negative.  Negative for nosebleeds.   Eyes: Negative.  Negative for blurred vision, double vision and photophobia.  Respiratory: Negative.  Negative for cough and shortness of breath.   Cardiovascular: Negative.  Negative for chest pain, palpitations and leg swelling.  Gastrointestinal: Negative.  Negative for heartburn, nausea and vomiting.  Genitourinary:  Positive for frequency. Negative for  dysuria, flank pain, hematuria and urgency.       SEE HPI  Musculoskeletal: Negative.  Negative for myalgias.  Skin: Negative.   Neurological: Negative.  Negative for dizziness, focal weakness, seizures and headaches.  Endo/Heme/Allergies: Negative.   Psychiatric/Behavioral: Negative.  Negative for suicidal ideas.     Past Medical History:  Diagnosis Date   Depression    was on meds prior to moving here post partum   Late prenatal care    Urticaria     Past Surgical History:  Procedure Laterality Date   TUBAL LIGATION Bilateral 01/28/2018   Procedure: POST PARTUM TUBAL LIGATION;  Surgeon: Kandis Devaughn Sayres, MD;  Location: [facility] BIRTHING SUITES;  Service: Gynecology;  Laterality: Bilateral;    Family History  Problem Relation Age of Onset   Other Other        Moebius syndrome   Hypertension Mother    Colon cancer Mother    Cervical cancer Mother    Diabetes Mother    Hyperlipidemia Mother    Anemia Daughter        blood transfusion at 10mos   Vision loss Daughter        eye surgery   Other Daughter        Sotos syndrome   Hypertension Father    Diabetes Father    Asthma Sister    Allergic rhinitis Neg Hx    Angioedema Neg Hx    Atopy Neg Hx    Eczema Neg Hx  Immunodeficiency Neg Hx    Urticaria Neg Hx     Social History Reviewed with no changes to be made today.   Outpatient Medications Prior to Visit  Medication Sig Dispense Refill   amitriptyline  (ELAVIL ) 10 MG tablet TAKE 1 TABLET(10 MG) BY MOUTH AT BEDTIME (Patient not taking: Reported on 07/20/2024) 30 tablet 1   dexamethasone  0.5 MG/5ML elixir 5 mL swish and spit three to four times daily. It is important to keep the medication in the mouth for five minutes prior to spitting it out. Do not rinse afterward and avoid eating or drinking for 30 minutes. (Patient not taking: Reported on 07/20/2024) 240 mL 0   doxycycline  (VIBRA -TABS) 100 MG tablet Take 1 tablet (100 mg total) by mouth 2 (two) times daily. (Patient  not taking: Reported on 07/20/2024) 16 tablet 1   EPINEPHrine  (EPIPEN  2-PAK) 0.3 mg/0.3 mL IJ SOAJ injection Inject 0.3 mg into the muscle as needed for anaphylaxis. Severe allergic reaction (Patient not taking: Reported on 07/20/2024) 1 each 11   ibuprofen  (ADVIL ) 600 MG tablet Take 1 tablet (600 mg total) by mouth every 6 (six) hours as needed. (Patient not taking: Reported on 07/20/2024) 30 tablet 0   lidocaine  (XYLOCAINE ) 5 % ointment Apply 1 Application topically as needed. For numbing (Patient not taking: Reported on 07/20/2024) 60 g 0   No facility-administered medications prior to visit.    Allergies  Allergen Reactions   Scallops [Shellfish Allergy] Swelling       Objective:    BP 98/67 (BP Location: Right Arm, Patient Position: Sitting, Cuff Size: Normal)   Pulse 80   Resp 18   Ht 5' 1 (1.549 m)   Wt 139 lb 6.4 oz (63.2 kg)   LMP 07/04/2024 (Exact Date)   SpO2 99%   BMI 26.34 kg/m  Wt Readings from Last 3 Encounters:  07/20/24 139 lb 6.4 oz (63.2 kg)  04/23/23 133 lb 3.2 oz (60.4 kg)  10/29/21 150 lb 12.8 oz (68.4 kg)    Physical Exam Vitals and nursing note reviewed.  Constitutional:      Appearance: She is well-developed.  HENT:     Head: Normocephalic and atraumatic.  Cardiovascular:     Rate and Rhythm: Normal rate and regular rhythm.     Heart sounds: Normal heart sounds. No murmur heard.    No friction rub. No gallop.  Pulmonary:     Effort: Pulmonary effort is normal. No tachypnea or respiratory distress.     Breath sounds: Normal breath sounds. No decreased breath sounds, wheezing, rhonchi or rales.  Chest:     Chest wall: No tenderness.  Abdominal:     General: Bowel sounds are normal.     Palpations: Abdomen is soft.  Musculoskeletal:        General: Normal range of motion.     Cervical back: Normal range of motion.  Skin:    General: Skin is warm and dry.  Neurological:     Mental Status: She is alert and oriented to person, place, and time.      Coordination: Coordination normal.  Psychiatric:        Behavior: Behavior normal. Behavior is cooperative.        Thought Content: Thought content normal.        Judgment: Judgment normal.          Patient has been counseled extensively about nutrition and exercise as well as the importance of adherence with medications and regular follow-up. The patient  was given clear instructions to go to ER or return to medical center if symptoms don't improve, worsen or new problems develop. The patient verbalized understanding.   Follow-up: Return if symptoms worsen or fail to improve.   Haze LELON Servant, FNP-BC [facility] [place], [place] [phone]   07/20/2024, 2:16 PM

## 2024-07-20 NOTE — Patient Instructions (Signed)
 [facility] Located in: [facility] Address: [address], [place], [place] [address] Phone: [phone]

## 2024-07-21 LAB — CBC WITH DIFFERENTIAL/PLATELET
Basophils Absolute: 0 x10E3/uL (ref 0.0–0.2)
Basos: 0 %
EOS (ABSOLUTE): 0.1 x10E3/uL (ref 0.0–0.4)
Eos: 1 %
Hematocrit: 42 % (ref 34.0–46.6)
Hemoglobin: 13.4 g/dL (ref 11.1–15.9)
Immature Grans (Abs): 0 x10E3/uL (ref 0.0–0.1)
Immature Granulocytes: 0 %
Lymphocytes Absolute: 2.1 x10E3/uL (ref 0.7–3.1)
Lymphs: 25 %
MCH: 29.8 pg (ref 26.6–33.0)
MCHC: 31.9 g/dL (ref 31.5–35.7)
MCV: 94 fL (ref 79–97)
Monocytes Absolute: 0.7 x10E3/uL (ref 0.1–0.9)
Monocytes: 8 %
Neutrophils Absolute: 5.6 x10E3/uL (ref 1.4–7.0)
Neutrophils: 66 %
Platelets: 217 x10E3/uL (ref 150–450)
RBC: 4.49 x10E6/uL (ref 3.77–5.28)
RDW: 11.7 % (ref 11.7–15.4)
WBC: 8.4 x10E3/uL (ref 3.4–10.8)

## 2024-07-21 LAB — URINALYSIS, COMPLETE
Bilirubin, UA: NEGATIVE
Glucose, UA: NEGATIVE
Ketones, UA: NEGATIVE
Leukocytes,UA: NEGATIVE
Nitrite, UA: NEGATIVE
Protein,UA: NEGATIVE
RBC, UA: NEGATIVE
Specific Gravity, UA: 1.01 (ref 1.005–1.030)
Urobilinogen, Ur: 0.2 mg/dL (ref 0.2–1.0)
pH, UA: 6.5 (ref 5.0–7.5)

## 2024-07-21 LAB — CERVICOVAGINAL ANCILLARY ONLY
Bacterial Vaginitis (gardnerella): POSITIVE — AB
Candida Glabrata: NEGATIVE
Candida Vaginitis: POSITIVE — AB
Chlamydia: NEGATIVE
Comment: NEGATIVE
Comment: NEGATIVE
Comment: NEGATIVE
Comment: NEGATIVE
Comment: NEGATIVE
Comment: NORMAL
Neisseria Gonorrhea: NEGATIVE
Trichomonas: NEGATIVE

## 2024-07-21 LAB — CMP14+EGFR
ALT: 10 IU/L (ref 0–32)
AST: 14 IU/L (ref 0–40)
Albumin: 4.3 g/dL (ref 3.9–4.9)
Alkaline Phosphatase: 38 IU/L — ABNORMAL LOW (ref 44–121)
BUN/Creatinine Ratio: 16 (ref 9–23)
BUN: 11 mg/dL (ref 6–20)
Bilirubin Total: <0.2 mg/dL (ref 0.0–1.2)
CO2: 23 mmol/L (ref 20–29)
Calcium: 9.6 mg/dL (ref 8.7–10.2)
Chloride: 102 mmol/L (ref 96–106)
Creatinine, Ser: 0.67 mg/dL (ref 0.57–1.00)
Globulin, Total: 2.6 g/dL (ref 1.5–4.5)
Glucose: 102 mg/dL — ABNORMAL HIGH (ref 70–99)
Potassium: 4.3 mmol/L (ref 3.5–5.2)
Sodium: 140 mmol/L (ref 134–144)
Total Protein: 6.9 g/dL (ref 6.0–8.5)
eGFR: 119 mL/min/1.73 (ref >59)

## 2024-07-21 LAB — MICROSCOPIC EXAMINATION
Bacteria, UA: NONE SEEN
Casts: NONE SEEN /LPF
RBC, Urine: NONE SEEN /HPF (ref 0–2)
WBC, UA: NONE SEEN /HPF (ref 0–5)

## 2024-07-27 ENCOUNTER — Ambulatory Visit: Payer: Self-pay | Admitting: Nurse Practitioner

## 2024-07-27 DIAGNOSIS — B9689 Other specified bacterial agents as the cause of diseases classified elsewhere: Secondary | ICD-10-CM

## 2024-07-27 DIAGNOSIS — B3731 Acute candidiasis of vulva and vagina: Secondary | ICD-10-CM

## 2024-07-27 MED ORDER — METRONIDAZOLE 500 MG PO TABS: 500.0000 mg | ORAL_TABLET | Freq: Two times a day (BID) | ORAL | 0 refills | Status: AC

## 2024-07-27 MED ORDER — FLUCONAZOLE 150 MG PO TABS: 150.0000 mg | ORAL_TABLET | Freq: Once | ORAL | 0 refills | Status: AC

## 2024-10-06 ENCOUNTER — Encounter

## 2024-10-06 ENCOUNTER — Inpatient Hospital Stay: Admission: RE | Admit: 2024-10-06 | Source: Ambulatory Visit

## 2024-12-02 ENCOUNTER — Encounter

## 2024-12-02 ENCOUNTER — Other Ambulatory Visit
